# Patient Record
Sex: Female | Born: 1985 | Race: White | Hispanic: No | Marital: Single | Smoking: Never smoker
Health system: Southern US, Community
[De-identification: ages and names within clinical notes are randomized; demographics above are authoritative.]

## PROBLEM LIST (undated history)

## (undated) DIAGNOSIS — M797 Fibromyalgia: Secondary | ICD-10-CM

## (undated) DIAGNOSIS — E039 Hypothyroidism, unspecified: Secondary | ICD-10-CM

## (undated) DIAGNOSIS — J45901 Unspecified asthma with (acute) exacerbation: Secondary | ICD-10-CM

## (undated) DIAGNOSIS — D649 Anemia, unspecified: Secondary | ICD-10-CM

## (undated) DIAGNOSIS — R55 Syncope and collapse: Secondary | ICD-10-CM

## (undated) DIAGNOSIS — N83209 Unspecified ovarian cyst, unspecified side: Secondary | ICD-10-CM

## (undated) DIAGNOSIS — F32A Depression, unspecified: Secondary | ICD-10-CM

## (undated) DIAGNOSIS — N809 Endometriosis, unspecified: Secondary | ICD-10-CM

## (undated) DIAGNOSIS — E038 Other specified hypothyroidism: Secondary | ICD-10-CM

## (undated) DIAGNOSIS — F419 Anxiety disorder, unspecified: Secondary | ICD-10-CM

## (undated) DIAGNOSIS — K219 Gastro-esophageal reflux disease without esophagitis: Secondary | ICD-10-CM

## (undated) DIAGNOSIS — N159 Renal tubulo-interstitial disease, unspecified: Secondary | ICD-10-CM

## (undated) DIAGNOSIS — R5382 Chronic fatigue, unspecified: Secondary | ICD-10-CM

## (undated) DIAGNOSIS — G9332 Myalgic encephalomyelitis/chronic fatigue syndrome: Secondary | ICD-10-CM

## (undated) DIAGNOSIS — G901 Familial dysautonomia [Riley-Day]: Secondary | ICD-10-CM

## (undated) DIAGNOSIS — R12 Heartburn: Secondary | ICD-10-CM

## (undated) DIAGNOSIS — F329 Major depressive disorder, single episode, unspecified: Secondary | ICD-10-CM

## (undated) HISTORY — DX: Heartburn: R12

## (undated) HISTORY — DX: Gastro-esophageal reflux disease without esophagitis: K21.9

## (undated) HISTORY — DX: Anxiety disorder, unspecified: F41.9

## (undated) HISTORY — DX: Other specified hypothyroidism: E03.8

## (undated) HISTORY — DX: Fibromyalgia: M79.7

## (undated) HISTORY — DX: Endometriosis, unspecified: N80.9

## (undated) HISTORY — DX: Anemia, unspecified: D64.9

## (undated) HISTORY — DX: Unspecified ovarian cyst, unspecified side: N83.209

## (undated) HISTORY — DX: Depression, unspecified: F32.A

## (undated) HISTORY — PX: CYST EXCISION: SHX5701

## (undated) HISTORY — DX: Hypothyroidism, unspecified: E03.9

## (undated) HISTORY — PX: WISDOM TOOTH EXTRACTION: SHX21

## (undated) HISTORY — PX: DIAGNOSTIC LAPAROSCOPY: SUR761

## (undated) HISTORY — DX: Major depressive disorder, single episode, unspecified: F32.9

## (undated) HISTORY — PX: OTHER SURGICAL HISTORY: SHX169

---
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
## 2003-02-21 ENCOUNTER — Encounter: Payer: Self-pay | Admitting: Internal Medicine

## 2003-07-04 ENCOUNTER — Other Ambulatory Visit: Admission: RE | Admit: 2003-07-04 | Discharge: 2003-07-04 | Payer: Self-pay | Admitting: Family Medicine

## 2003-12-17 ENCOUNTER — Encounter: Admission: RE | Admit: 2003-12-17 | Discharge: 2003-12-17 | Payer: Self-pay | Admitting: Urology

## 2004-01-24 ENCOUNTER — Inpatient Hospital Stay (HOSPITAL_COMMUNITY): Admission: AD | Admit: 2004-01-24 | Discharge: 2004-01-24 | Payer: Self-pay | Admitting: Obstetrics & Gynecology

## 2004-01-26 ENCOUNTER — Emergency Department (HOSPITAL_COMMUNITY): Admission: EM | Admit: 2004-01-26 | Discharge: 2004-01-26 | Payer: Self-pay | Admitting: Emergency Medicine

## 2004-01-26 ENCOUNTER — Encounter (INDEPENDENT_AMBULATORY_CARE_PROVIDER_SITE_OTHER): Payer: Self-pay | Admitting: *Deleted

## 2004-01-29 ENCOUNTER — Encounter (INDEPENDENT_AMBULATORY_CARE_PROVIDER_SITE_OTHER): Payer: Self-pay | Admitting: *Deleted

## 2004-01-29 ENCOUNTER — Encounter (INDEPENDENT_AMBULATORY_CARE_PROVIDER_SITE_OTHER): Payer: Self-pay | Admitting: Specialist

## 2004-01-29 ENCOUNTER — Ambulatory Visit (HOSPITAL_COMMUNITY): Admission: RE | Admit: 2004-01-29 | Discharge: 2004-01-29 | Payer: Self-pay | Admitting: Obstetrics and Gynecology

## 2006-07-18 ENCOUNTER — Ambulatory Visit: Payer: Self-pay | Admitting: Psychiatry

## 2006-07-18 ENCOUNTER — Inpatient Hospital Stay (HOSPITAL_COMMUNITY): Admission: AD | Admit: 2006-07-18 | Discharge: 2006-07-21 | Payer: Self-pay | Admitting: Psychiatry

## 2006-09-05 ENCOUNTER — Ambulatory Visit: Payer: Self-pay | Admitting: Internal Medicine

## 2006-10-13 ENCOUNTER — Ambulatory Visit: Payer: Self-pay | Admitting: Internal Medicine

## 2006-11-23 ENCOUNTER — Encounter (INDEPENDENT_AMBULATORY_CARE_PROVIDER_SITE_OTHER): Payer: Self-pay | Admitting: *Deleted

## 2006-11-26 ENCOUNTER — Encounter (INDEPENDENT_AMBULATORY_CARE_PROVIDER_SITE_OTHER): Payer: Self-pay | Admitting: *Deleted

## 2006-12-07 ENCOUNTER — Encounter (INDEPENDENT_AMBULATORY_CARE_PROVIDER_SITE_OTHER): Payer: Self-pay | Admitting: *Deleted

## 2007-01-11 ENCOUNTER — Encounter (INDEPENDENT_AMBULATORY_CARE_PROVIDER_SITE_OTHER): Payer: Self-pay | Admitting: *Deleted

## 2007-07-24 ENCOUNTER — Encounter: Admission: RE | Admit: 2007-07-24 | Discharge: 2007-07-24 | Payer: Self-pay | Admitting: *Deleted

## 2007-07-25 ENCOUNTER — Ambulatory Visit (HOSPITAL_COMMUNITY): Admission: RE | Admit: 2007-07-25 | Discharge: 2007-07-25 | Payer: Self-pay | Admitting: Cardiovascular Disease

## 2007-09-20 ENCOUNTER — Encounter (INDEPENDENT_AMBULATORY_CARE_PROVIDER_SITE_OTHER): Payer: Self-pay | Admitting: *Deleted

## 2007-10-18 ENCOUNTER — Encounter (INDEPENDENT_AMBULATORY_CARE_PROVIDER_SITE_OTHER): Payer: Self-pay | Admitting: *Deleted

## 2007-12-10 ENCOUNTER — Emergency Department (HOSPITAL_COMMUNITY): Admission: EM | Admit: 2007-12-10 | Discharge: 2007-12-11 | Payer: Self-pay | Admitting: Emergency Medicine

## 2008-01-15 ENCOUNTER — Emergency Department (HOSPITAL_COMMUNITY): Admission: EM | Admit: 2008-01-15 | Discharge: 2008-01-15 | Payer: Self-pay | Admitting: Family Medicine

## 2008-01-29 ENCOUNTER — Ambulatory Visit: Payer: Self-pay | Admitting: Internal Medicine

## 2008-01-29 DIAGNOSIS — J45901 Unspecified asthma with (acute) exacerbation: Secondary | ICD-10-CM | POA: Insufficient documentation

## 2008-01-29 HISTORY — DX: Unspecified asthma with (acute) exacerbation: J45.901

## 2008-02-13 ENCOUNTER — Telehealth: Payer: Self-pay | Admitting: Internal Medicine

## 2008-03-06 ENCOUNTER — Encounter (INDEPENDENT_AMBULATORY_CARE_PROVIDER_SITE_OTHER): Payer: Self-pay | Admitting: *Deleted

## 2008-03-14 ENCOUNTER — Telehealth (INDEPENDENT_AMBULATORY_CARE_PROVIDER_SITE_OTHER): Payer: Self-pay | Admitting: *Deleted

## 2008-05-01 ENCOUNTER — Emergency Department (HOSPITAL_COMMUNITY): Admission: EM | Admit: 2008-05-01 | Discharge: 2008-05-01 | Payer: Self-pay | Admitting: Emergency Medicine

## 2008-05-03 ENCOUNTER — Emergency Department (HOSPITAL_COMMUNITY): Admission: EM | Admit: 2008-05-03 | Discharge: 2008-05-03 | Payer: Self-pay | Admitting: *Deleted

## 2008-05-03 ENCOUNTER — Encounter (INDEPENDENT_AMBULATORY_CARE_PROVIDER_SITE_OTHER): Payer: Self-pay | Admitting: *Deleted

## 2008-05-17 DIAGNOSIS — K219 Gastro-esophageal reflux disease without esophagitis: Secondary | ICD-10-CM | POA: Insufficient documentation

## 2008-05-17 DIAGNOSIS — F411 Generalized anxiety disorder: Secondary | ICD-10-CM | POA: Insufficient documentation

## 2008-05-17 DIAGNOSIS — F319 Bipolar disorder, unspecified: Secondary | ICD-10-CM | POA: Insufficient documentation

## 2008-05-17 DIAGNOSIS — IMO0001 Reserved for inherently not codable concepts without codable children: Secondary | ICD-10-CM | POA: Insufficient documentation

## 2008-05-17 DIAGNOSIS — R5382 Chronic fatigue, unspecified: Secondary | ICD-10-CM | POA: Insufficient documentation

## 2008-05-17 DIAGNOSIS — G9332 Myalgic encephalomyelitis/chronic fatigue syndrome: Secondary | ICD-10-CM | POA: Insufficient documentation

## 2008-05-17 DIAGNOSIS — N809 Endometriosis, unspecified: Secondary | ICD-10-CM | POA: Insufficient documentation

## 2008-05-17 DIAGNOSIS — F3289 Other specified depressive episodes: Secondary | ICD-10-CM | POA: Insufficient documentation

## 2008-05-17 DIAGNOSIS — G2581 Restless legs syndrome: Secondary | ICD-10-CM | POA: Insufficient documentation

## 2008-05-17 DIAGNOSIS — F329 Major depressive disorder, single episode, unspecified: Secondary | ICD-10-CM

## 2008-05-22 ENCOUNTER — Ambulatory Visit: Payer: Self-pay | Admitting: Internal Medicine

## 2008-05-22 DIAGNOSIS — R1011 Right upper quadrant pain: Secondary | ICD-10-CM | POA: Insufficient documentation

## 2008-05-23 ENCOUNTER — Ambulatory Visit (HOSPITAL_COMMUNITY): Admission: RE | Admit: 2008-05-23 | Discharge: 2008-05-23 | Payer: Self-pay | Admitting: Internal Medicine

## 2008-05-23 ENCOUNTER — Encounter: Payer: Self-pay | Admitting: Internal Medicine

## 2008-05-24 ENCOUNTER — Encounter: Payer: Self-pay | Admitting: Internal Medicine

## 2008-05-28 ENCOUNTER — Ambulatory Visit: Payer: Self-pay | Admitting: Internal Medicine

## 2008-06-03 ENCOUNTER — Encounter: Payer: Self-pay | Admitting: Internal Medicine

## 2008-09-21 ENCOUNTER — Emergency Department (HOSPITAL_COMMUNITY): Admission: EM | Admit: 2008-09-21 | Discharge: 2008-09-21 | Payer: Self-pay | Admitting: Emergency Medicine

## 2009-04-03 ENCOUNTER — Encounter: Payer: Self-pay | Admitting: Internal Medicine

## 2009-07-24 ENCOUNTER — Telehealth: Payer: Self-pay | Admitting: Internal Medicine

## 2009-07-24 ENCOUNTER — Ambulatory Visit: Payer: Self-pay | Admitting: Gastroenterology

## 2009-07-24 DIAGNOSIS — R1084 Generalized abdominal pain: Secondary | ICD-10-CM | POA: Insufficient documentation

## 2009-07-24 DIAGNOSIS — K5649 Other impaction of intestine: Secondary | ICD-10-CM | POA: Insufficient documentation

## 2009-07-24 DIAGNOSIS — R195 Other fecal abnormalities: Secondary | ICD-10-CM | POA: Insufficient documentation

## 2009-07-24 DIAGNOSIS — R110 Nausea: Secondary | ICD-10-CM | POA: Insufficient documentation

## 2009-07-25 ENCOUNTER — Encounter: Payer: Self-pay | Admitting: Nurse Practitioner

## 2009-07-25 DIAGNOSIS — B820 Intestinal helminthiasis, unspecified: Secondary | ICD-10-CM | POA: Insufficient documentation

## 2009-07-26 ENCOUNTER — Encounter: Payer: Self-pay | Admitting: Nurse Practitioner

## 2009-07-29 ENCOUNTER — Telehealth: Payer: Self-pay | Admitting: Internal Medicine

## 2009-08-20 ENCOUNTER — Ambulatory Visit: Payer: Self-pay | Admitting: Internal Medicine

## 2009-08-20 LAB — CONVERTED CEMR LAB
ALT: 18 U/L (ref 0–35)
AST: 20 U/L (ref 0–37)
Albumin: 3.3 g/dL — ABNORMAL LOW (ref 3.5–5.2)
Alkaline Phosphatase: 65 U/L (ref 39–117)
Amylase: 74 U/L (ref 27–131)
Basophils Absolute: 0 K/uL (ref 0.0–0.1)
Basophils Relative: 0.9 % (ref 0.0–3.0)
Bilirubin, Direct: 0.2 mg/dL (ref 0.0–0.3)
Eosinophils Absolute: 0.1 K/uL (ref 0.0–0.7)
Eosinophils Relative: 2 % (ref 0.0–5.0)
HCT: 36.7 % (ref 36.0–46.0)
Hemoglobin: 13 g/dL (ref 12.0–15.0)
Lipase: 22 U/L (ref 11.0–59.0)
Lymphocytes Relative: 41.4 % (ref 12.0–46.0)
Lymphs Abs: 1.9 K/uL (ref 0.7–4.0)
MCHC: 35.3 g/dL (ref 30.0–36.0)
MCV: 90.5 fL (ref 78.0–100.0)
Monocytes Absolute: 0.3 K/uL (ref 0.1–1.0)
Monocytes Relative: 6.8 % (ref 3.0–12.0)
Neutro Abs: 2.4 K/uL (ref 1.4–7.7)
Neutrophils Relative %: 48.9 % (ref 43.0–77.0)
Platelets: 192 K/uL (ref 150.0–400.0)
RBC: 4.06 M/uL (ref 3.87–5.11)
RDW: 12.7 % (ref 11.5–14.6)
Total Bilirubin: 0.6 mg/dL (ref 0.3–1.2)
Total Protein: 6.5 g/dL (ref 6.0–8.3)
WBC: 4.7 10*3/microliter (ref 4.5–10.5)

## 2009-08-27 ENCOUNTER — Ambulatory Visit (HOSPITAL_COMMUNITY): Admission: RE | Admit: 2009-08-27 | Discharge: 2009-08-27 | Payer: Self-pay | Admitting: Cardiology

## 2009-09-01 ENCOUNTER — Telehealth: Payer: Self-pay | Admitting: Internal Medicine

## 2009-09-08 ENCOUNTER — Ambulatory Visit (HOSPITAL_COMMUNITY): Admission: RE | Admit: 2009-09-08 | Discharge: 2009-09-08 | Payer: Self-pay | Admitting: Internal Medicine

## 2009-09-19 ENCOUNTER — Telehealth: Payer: Self-pay | Admitting: Internal Medicine

## 2009-09-22 ENCOUNTER — Ambulatory Visit: Payer: Self-pay | Admitting: Internal Medicine

## 2009-09-22 DIAGNOSIS — R112 Nausea with vomiting, unspecified: Secondary | ICD-10-CM | POA: Insufficient documentation

## 2009-09-23 ENCOUNTER — Encounter: Payer: Self-pay | Admitting: Internal Medicine

## 2009-09-24 ENCOUNTER — Encounter: Payer: Self-pay | Admitting: Internal Medicine

## 2009-12-18 ENCOUNTER — Telehealth: Payer: Self-pay | Admitting: Internal Medicine

## 2009-12-23 ENCOUNTER — Emergency Department (HOSPITAL_COMMUNITY): Admission: EM | Admit: 2009-12-23 | Discharge: 2009-12-23 | Payer: Self-pay | Admitting: Emergency Medicine

## 2010-06-28 ENCOUNTER — Encounter: Payer: Self-pay | Admitting: Internal Medicine

## 2010-07-07 NOTE — Progress Notes (Signed)
Summary: Provider Switch Request  Phone Note Call from Patient   Caller: Patient Call For: Dr. Juanda Chance Reason for Call: Talk to Nurse Summary of Call: pt. would like to switch from Dr. Juanda Chance to Dr. Leone Payor b/c last time she saw Dr. Juanda Chance she made a comment about her "being overweight." Initial call taken by: Karna Christmas,  December 18, 2009 9:54 AM  Follow-up for Phone Call        DR.Marketta Valadez--Do you approve switch? Follow-up by: Laureen Ochs LPN,  December 18, 2009 2:55 PM  Additional Follow-up for Phone Call Additional follow up Details #1::        yes, I hope Dr Leone Payor takes her. Additional Follow-up by: Hart Carwin MD,  December 18, 2009 5:38 PM    Additional Follow-up for Phone Call Additional follow up Details #2::    DR.GESSNER--Will you accept this patient? (I believe several of her family members have also switched to you.) Follow-up by: Laureen Ochs LPN,  December 19, 2009 8:06 AM  Additional Follow-up for Phone Call Additional follow up Details #3:: Details for Additional Follow-up Action Taken: Not a legitimate reason to switch, so I do not accept. BMI is 29 and she is overweight and it is appropriate for her to be told that and I would do same and tell her to lose Iva Boop MD, [facility]  December 19, 2009 12:21 PM      Appended Document: Provider Switch Request Message left for patient to callback.  Appended Document: Provider Switch Request Per pt's mother, pt. has a history of an eating disorder, states Dr.Brailyn Killion stating pt. needed to loose weight has caused pt's depression to become worse. I advised pt's mother of above MD decision, advised that Jolina is still a pt. of Dr.Odai Wimmer here at [facility] and that pt. may seek GI care at another GI office if she wishes. Pt's mother stated thank you.

## 2010-07-07 NOTE — Progress Notes (Signed)
Summary: Triage-Severe reflux and nausea  Phone Note Call from Patient Call back at Home Phone [phone]   Caller: mother Silvio Pate Call For: Dr. Juanda Chance Reason for Call: Talk to Nurse Summary of Call: Having acid reflux even sitting up, vomiting. Initial call taken by: Karna Christmas,  September 19, 2009 9:50 AM  Follow-up for Phone Call        Per pt. mother, pt. continues w/episodes of reflux and nausea, had a bad episode yesterday and last night. Takes Phenergan 12.5mg  Q4-6, Protonix 40mg  daily, Prilosec PRN, Bentyl as needed.  1) See Dr.Trystyn Sitts on 09-22-09 at 2:45am 2) Clear liquids x24 hours, then full liquids X24 hours, then advance to soft,bland diet x2-4 days. Advanced as tolerated. 3) Bentyl 10mg  two times a day until OV 4) Maalox,Mylanta,etc  as needed 5) Continue Protonix QD and Phenergan PRN 6) If symptoms become worse call back immediately or go to ER. 7) I will call pt., if new orders, after MD reviews.    Follow-up by: Laureen Ochs LPN,  September 19, 2009 10:16 AM  Additional Follow-up for Phone Call Additional follow up Details #1::        Per Dr.Anaaya Fuster-Above instructions are O.K.  Change Phenergan to Zofran. Pt. to keep scheduled office visit 09-22-09 and call back as needed. Reviewed with Kadie's father by phone, Zofran to her pharmacy.   Additional Follow-up by: Laureen Ochs LPN,  September 19, 2009 12:58 PM    New/Updated Medications: ONDANSETRON 4 MG TBDP (ONDANSETRON) Take 1 every 6-8 hours as needed for nausea. Prescriptions: ONDANSETRON 4 MG TBDP (ONDANSETRON) Take 1 every 6-8 hours as needed for nausea.  #20 x 1   Entered by:   Laureen Ochs LPN   Authorized by:   Hart Carwin MD   Signed by:   Laureen Ochs LPN on 14/78/2956   Method used:   Electronically to        [facility] [address] [phone]* (retail)       [address]       [place], [place]  [address]       Ph: [phone] or [phone]       Fax: [phone]   RxID:    [id number]

## 2010-07-07 NOTE — Assessment & Plan Note (Signed)
Summary: SEVERE REFLUX, NAUSEA            DEBORAH   History of Present Illness Visit Type: Follow-up Visit Primary GI MD: Lina Sar MD Primary Provider: Gilmore Laroche, MD Chief Complaint: acid reflux and nausea-Zofran, bentyl and protonix helping some, pt has been on broth and clear liquids the last four days, pt also states she is having a pain in her left arm that begins in left shoulder and shoots down to her hand that can make her hand numb History of Present Illness:   This is a 25 year old white female with periodic nausea, vomiting and severe gastroesophageal reflux demonstrated on an upper GI series showing free reflux up to the level of the esophagus on 08/27/09. Her ultrasound and HIDA scan  have been negative. An upper endoscopy completed in December 2009 which was negative. She has bipolar disorder, fibromyalgia and depression as well as chronic fatigue syndrome. She is currently not working because of her health issues. She is interested in the possibility of a Nissen fundoplication. She comes today with her mother.   GI Review of Systems    Reports acid reflux and  nausea.          Current Medications (verified): 1)  Prozac 20 Mg Caps (Fluoxetine Hcl) .... Take 1 Tablet By Mouth Once A Day 2)  Nuvaring 0.12-0.015 Mg/24hr Ring (Etonogestrel-Ethinyl Estradiol) .... Use As Directed 3)  Ondansetron 4 Mg Tbdp (Ondansetron) .... Take 1 Every 6-8 Hours As Needed For Nausea. 4)  Bentyl 10 Mg Caps (Dicyclomine Hcl) .... Take 1 Tab Twice Daily As Needed For Abd Cramps, Spasms 5)  Protonix 40 Mg Tbec (Pantoprazole Sodium) .... Take 1 Tablet By Mouth Once A Day 6)  Pepto-Bismol 262 Mg Tabs (Bismuth Subsalicylate) .... As Needed 7)  Topamax 50 Mg Tabs (Topiramate) .... Take 1 Tablet By Mouth Once A Day  Allergies: 1)  ! Codeine 2)  Ambien (Zolpidem Tartrate) 3)  Lamictal (Lamotrigine) 4)  Abilify (Aripiprazole)  Past History:  Past Medical History: Asthma Current Problems:    ENDOMETRIOSIS (ICD-617.9) FIBROMYALGIA (ICD-729.1) CHRONIC FATIGUE SYNDROME (ICD-780.71) BIPOLAR AFFECTIVE DISORDER (ICD-296.80) GERD (ICD-530.81) RESTLESS LEG SYNDROME (ICD-333.94) ANXIETY DISORDER (ICD-300.00) DEPRESSION (ICD-311) ASTHMA UNSPECIFIED WITH EXACERBATION (ICD-493.92) Chronic Headaches  Past Surgical History: Reviewed history from 05/17/2008 and no changes required. laporoscopy for endometriosis  Family History: Reviewed history from 05/17/2008 and no changes required. neg resp dz Family History of Breast Cancer: Paternal Grandmother No FH of Colon Cancer: Family History of Irritable Bowel Syndrome: Mother Family History of Diabetes: Grandfather  Social History: Reviewed history from 05/22/2008 and no changes required. never smoker Alcohol Use - yes-1 time per month Illicit Drug Use - no Occupation: Unemployed  Review of Systems       The patient complains of allergy/sinus and cough.  The patient denies anemia, anxiety-new, arthritis/joint pain, back pain, blood in urine, breast changes/lumps, confusion, coughing up blood, depression-new, fainting, fatigue, fever, headaches-new, hearing problems, heart murmur, heart rhythm changes, itching, menstrual pain, muscle pains/cramps, night sweats, nosebleeds, pregnancy symptoms, shortness of breath, skin rash, sleeping problems, sore throat, swelling of feet/legs, swollen lymph glands, thirst - excessive, urination - excessive, urination changes/pain, urine leakage, vision changes, and voice change.         Pertinent positive and negative review of systems were noted in the above HPI. All other ROS was otherwise negative.   Vital Signs:  Patient profile:   25 year old female Height:      66 inches  Weight:      180 pounds BMI:     29.16 Pulse rate:   92 / minute Pulse rhythm:   regular BP sitting:   100 / 66  (left arm) Cuff size:   regular  Vitals Entered By: Francee Piccolo CMA Duncan Dull) (September 22, 2009 3:04  PM)  Physical Exam  General:  depressed-appearing, alert, oriented. Eyes:  PERRLA, no icterus. Mouth:  No deformity or lesions, dentition normal. Neck:  Supple; no masses or thyromegaly. Lungs:  Clear throughout to auscultation. Heart:  Regular rate and rhythm; no murmurs, rubs,  or bruits. Abdomen:  soft relaxed abdomen with minimal tenderness in epigastrium towards the midline. Lower abdomen unremarkable. Liver edge at costal margin. Extremities:  No clubbing, cyanosis, edema or deformities noted. Skin:  Intact without significant lesions or rashes. Psych:  Alert and cooperative. Normal mood and affect.   Impression & Recommendations:  Problem # 1:  NAUSEA AND VOMITING (ICD-787.01) Patient has nausea and vomiting. We need to rule out gastroparesis or functional vomiting. Patient appears to be depressed and anxious. She has free gastroesophageal reflux demonstrated on an upper GI series. At this point, I will increase the Protonix to twice a day dosing before considering fundoplication.I would be reluctant to consider Nissen Fundoplication  because of so many psychological issues.  Problem # 2:  FIBROMYALGIA (ICD-729.1) Patient appears to be depressed. She is unable to work. She is taking Prozac 60 mg daily which may affect her gastric emptying.  Other Orders: Gastric Emptying Scan (GES)  Patient Instructions: 1)  You have been scheduled for a gastric emptying scan to see how long it takes your stomach to empty. This will be at [facility] on 10/01/09 @ 11:00 am. 2)  Please discontinue your ondansetron 3 days prior to test.  3)  We have sent a new prescription for Protonix two times a day to your pharmacy. 4)  Copy sent to : Gilmore Laroche, MD 5)  The medication list was reviewed and reconciled.  All changed / newly prescribed medications were explained.  A complete medication list was provided to the patient / caregiver. Prescriptions: PROTONIX 40 MG TBEC (PANTOPRAZOLE  SODIUM) Take 1 tablet by mouth two times a day (pharmacy-please d/c prescription for protonix once daily dosing!)  #60 x 3   Entered by:   Hortense Ramal CMA (AAMA)   Authorized by:   Hart Carwin MD   Signed by:   Hortense Ramal CMA (AAMA) on 09/22/2009   Method used:   Electronically to        [facility] [address] [phone]* (retail)       [address]       [place], [place]  [address]       Ph: [phone] or [phone]       Fax: [phone]   RxID:   [id number]   Appended Document: SEVERE REFLUX, NAUSEA            DEBORAH Of Note: Patient cancelled her gastric emptying scan and states that she does not plan to reschedule that. Dr Juanda Chance has been advised.

## 2010-07-07 NOTE — Medication Information (Signed)
Summary: Approval/[employer]  Approval/[employer]   Imported By: Lester Leeds 09/29/2009 10:07:00  _____________________________________________________________________  External Attachment:    Type:   Image     Comment:   External Document

## 2010-07-07 NOTE — Progress Notes (Signed)
Summary: Questions about Lab Orders  Phone Note From Other Clinic   Caller: [facility]  [phone] Call For: Dr. Juanda Chance Summary of Call: Has some questions about a Misc GI Procedure that was ordered Initial call taken by: Karna Christmas,  July 29, 2009 9:39 AM  Follow-up for Phone Call        Msg. left for Tara-Please identify the worm in the pt. stool, per Willette Cluster NP.  Follow-up by: Laureen Ochs LPN,  July 29, 2009 9:44 AM

## 2010-07-07 NOTE — Progress Notes (Signed)
Summary: TRIAGE  Phone Note Call from Patient Call back at [phone]   Caller: Patient Call For: Juanda Chance Reason for Call: Talk to Nurse Summary of Call: Patient is having a flare up of IBS has a lot of stomach cramping, wants to know if she can get pain meds called in Initial call taken by: Tawni Levy,  July 24, 2009 1:10 PM  Follow-up for Phone Call        Pt. c/o "Serious stomache cramps"  mid abd. since last night. Some nausea. Denies fever, constipation, diarrhea, blood, black stools. Pain is getting worse.  Pt. will see Willette Cluster NP today at 3pm.  Follow-up by: Laureen Ochs LPN,  July 24, 2009 1:41 PM

## 2010-07-07 NOTE — Miscellaneous (Signed)
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

## 2010-07-07 NOTE — Progress Notes (Signed)
Summary: wrong "form"  Phone Note Call from Patient Call back at Home Phone [phone]   Caller: mother, Velna Hatchet Call For: Dr. Juanda Chance Reason for Call: Talk to Nurse Summary of Call: mother thinks pt was given the wrong "form" regarding an exam with Nuclear Medicine Initial call taken by: Vallarie Mare,  September 01, 2009 9:59 AM  Follow-up for Phone Call        The form patient has was given to her for her own personal use so she will know when to arrive for tests etc..Marland KitchenMarland KitchenI have called radiology at [facility] who states that they do have an order on file for the HIDA scan and that there is no problem. Patient has been advised that no wrong form was given. Follow-up by: Hortense Ramal CMA Duncan Dull),  September 01, 2009 10:59 AM

## 2010-07-07 NOTE — Assessment & Plan Note (Signed)
Summary: WORSENING ABD. PAIN          (DR.BRODIE PT.)          DEBORAH   History of Present Illness Visit Type: Follow-up Visit Primary GI MD: Lina Sar MD Primary Provider: Tanya Nones at [facility] Chief Complaint: Non-stop epigastric abd pain all night History of Present Illness:   April [name] is back with recurrent episodes of nausea, abdominal cramps, bowel habit changes associated with worm-like structures in stool. She was treated by PCP in 2009 and again Nov. 2010 with Mebendazole after PCP saw a 6-7 cm roundworm in her stool  (I reviewed notes).  Structures size of angel hair pasta, whitish in color. This most recent episode started yesterday evening. She had one BM containing above structures, no BMs today. Today her abdominal cramps span the mid abdomen, they are constant.No dysuria, no unusual vaginal discharge. No weight loss.      GI Review of Systems    Reports abdominal pain and  nausea.     Location of  Abdominal pain: mid abdomen.    Denies acid reflux, belching, bloating, chest pain, dysphagia with liquids, dysphagia with solids, heartburn, loss of appetite, vomiting, vomiting blood, weight loss, and  weight gain.      Reports constipation and  diarrhea.     Denies anal fissure, black tarry stools, change in bowel habit, diverticulosis, fecal incontinence, heme positive stool, hemorrhoids, irritable bowel syndrome, jaundice, light color stool, liver problems, rectal bleeding, and  rectal pain.    Current Medications (verified): 1)  Prozac 20 Mg Caps (Fluoxetine Hcl) .... Take 1 Tablet By Mouth Once A Day 2)  Nuvaring 0.12-0.015 Mg/24hr Ring (Etonogestrel-Ethinyl Estradiol) .... Use As Directed  Allergies: 1)  ! Codeine 2)  ! * Ambien 3)  ! * Lamictal 4)  ! * Abilify  Past History:  Past Medical History: Reviewed history from 05/17/2008 and no changes required. Asthma Current Problems:  ENDOMETRIOSIS (ICD-617.9) FIBROMYALGIA (ICD-729.1) CHRONIC FATIGUE SYNDROME  (ICD-780.71) BIPOLAR AFFECTIVE DISORDER (ICD-296.80) GERD (ICD-530.81) RESTLESS LEG SYNDROME (ICD-333.94) ANXIETY DISORDER (ICD-300.00) DEPRESSION (ICD-311) ASTHMA UNSPECIFIED WITH EXACERBATION (AVW-098.11)  Past Surgical History: Reviewed history from 05/17/2008 and no changes required. laporoscopy for endometriosis  Family History: Reviewed history from 05/17/2008 and no changes required. neg resp dz Family History of Breast Cancer: Paternal Grandmother No FH of Colon Cancer: Family History of Irritable Bowel Syndrome: Mother Family History of Diabetes: Grandfather  Social History: Reviewed history from 05/22/2008 and no changes required. never smoker Alcohol Use - yes-1 time per month Illicit Drug Use - no Occupation: Unemployed  Review of Systems       The patient complains of allergy/sinus, arthritis/joint pain, fatigue, headaches-new, menstrual pain, nosebleeds, skin rash, and thirst - excessive.  The patient denies anemia, anxiety-new, back pain, blood in urine, breast changes/lumps, change in vision, confusion, cough, coughing up blood, depression-new, fainting, fever, hearing problems, heart murmur, heart rhythm changes, itching, muscle pains/cramps, night sweats, pregnancy symptoms, shortness of breath, sleeping problems, sore throat, swelling of feet/legs, swollen lymph glands, thirst - excessive , urination - excessive , urination changes/pain, urine leakage, vision changes, and voice change.    Vital Signs:  Patient profile:   25 year old female Height:      66 inches Weight:      179.38 pounds BMI:     29.06 Temp:     98.7 degrees F oral Pulse rate:   64 / minute Pulse rhythm:   regular BP sitting:   100 /  68  (left arm) Cuff size:   regular  Vitals Entered By: June McMurray CMA Duncan Dull) (July 24, 2009 3:20 PM)  Physical Exam  General:  Well developed, well nourished, no acute distress. Head:  Normocephalic and atraumatic. Mouth:  No oral lesions.  Tongue moist.  Neck:  no obvious masses  Lungs:  Clear throughout to auscultation. Heart:  Regular rate and rhythm; no murmurs, rubs,  or bruits. Abdomen:  Abdomen soft, nontender, nondistended. No obvious masses or hepatomegaly.Normal bowel sounds.  Msk:  Symmetrical with no gross deformities. Normal posture. Extremities:  No palmar erythema, no edema.  Neurologic:  Alert and  oriented x4;  grossly normal neurologically. Skin:  Intact without significant lesions or rashes. Cervical Nodes:  No significant cervical adenopathy. Psych:  Alert and cooperative. Normal mood and affect.   Impression & Recommendations:  Problem # 1:  ABDOMINAL PAIN -GENERALIZED (ICD-789.07) Assessment Comment Only Recurrent episode of mid abdominal pain, nausea, and passage of fecal worm-like structures in stool.  Treated by PCP in 2009 and again Nov. 2010 with  Mebendazole after PCP visualized  a 6-7cm roundworm in her feces. Stool for O + P and culture were negative. Her BMs are otherwise normal.  Last night she passed another abnormal stool. Today she is left with constant mid abdominal pain. No recent travel.  Colonoscopy (for abdominal pain in Dec. 2009) showed some abnormal mucosa of sigmoid. Biopsies compatible with colonic mucosa with superficial hemorrhages in lamina propria and reactive lymphoid aggregates. Patient looks healthy, her abdominal exam is unremarkable. Patient will follow up with Dr. Juanda Chance but in meantime I have asked her to take a stool container so that when this occurs again we can send sample for analysis. I will call [facility] as well as hospital (microbiology) to find out what type of stool analysis can be done. In the meantime, Phenergan for nausea and Bentyl for cramps.   Patient Instructions: 1)  Please pick up your medications at the pharmacy. 2)  You can go to the lab in our basement and they can give you a small container for stool in case you might need it. 3)  We made you a  follow up with Dr. Juanda Chance for 08-20-09 at 8:45Am. 4)  Copy sent to : Dr. Tanya Nones 5)  The medication list was reviewed and reconciled.  All changed / newly prescribed medications were explained.  A complete medication list was provided to the patient / caregiver. Prescriptions: BENTYL 10 MG CAPS (DICYCLOMINE HCL) Take 1 tab twice daily as needed for abd cramps, spasms  #60 x 0   Entered by:   Lowry Ram NCMA   Authorized by:   Willette Cluster NP   Signed by:   Lowry Ram NCMA on 07/24/2009   Method used:   Electronically to        [facility] [address] [phone]* (retail)       [address]       [place], [place]  [address]       Ph: [phone] or [phone]       Fax: [phone]   RxID:   [id number] PROMETHAZINE HCL 12.5 MG TABS (PROMETHAZINE HCL) Take 1 tab every 4-6 hours as needed for nausea Do not operate machinery.  #20 x 0   Entered by:   Lowry Ram NCMA   Authorized by:   Willette Cluster NP   Signed by:   Lowry Ram NCMA on  07/24/2009   Method used:   Electronically to        [facility] [address] [phone]* (retail)       [address]       [place], [place]  [address]       Ph: [phone] or [phone]       Fax: [phone]   RxID:   [id number]

## 2010-07-07 NOTE — Miscellaneous (Signed)
Summary: Protonix BID Approved/[employer]  Case ID: [id number] Member Number: [id number] Case Type: Initial Review Case Start Date: 09/23/2009 Case Status: Coverage has been APPROVED. You will receive a confirmation letter confirming approval of this medication. The patient will also be notified of this approval via an automated outbound phone call or a letter. Please allow approximately 2 hours to update our system with the approval. Once updated, the prescription can be re-submitted.   Coverage Start Date: 09/02/2009 Coverage End Date: 09/23/2010  Patient First Name: April Patient Last Name: [name] DOB: 06/10/1985 [address] Street Address: [address]   Patient City: [place] Patient State: [place] Patient Zip: [phone]  Drug Name & Strength: Pantoprazole Sodium 40 Mg

## 2010-07-07 NOTE — Assessment & Plan Note (Signed)
Summary: F/U ABD PAIN, SAW April [name] RNP   History of Present Illness Visit Type: Follow-up Visit Primary GI MD: Lina Sar MD Primary Provider: Tanya Nones at [facility] Chief Complaint: Patient states worsening abdominal pain, with nausea and vomiting after meals History of Present Illness:   This is a 25 year old white female whom we have seen several times for episodes of abdominal pain. She saw Willette Cluster, NP on 07/24/09 and was started on dicyclomine and Phenergan. She had another episode of severe abdominal pain and vomiting one week ago after eating seafood. She had a low-grade temperature, and is still feeling crampy pain but there has been no diarrhea and no fever. She has  a positive family history of gallbladder disease in her mother. Her upper abdominal ultrasound in December 2009 was negative. A colonoscopy in December 2009 was normal with normal biopsies and an upper endoscopy in December 2009 was also normal.   GI Review of Systems    Reports abdominal pain, bloating, heartburn, loss of appetite, nausea, and  vomiting.     Location of  Abdominal pain: lower abdomen.    Denies acid reflux, belching, chest pain, dysphagia with liquids, dysphagia with solids, vomiting blood, weight loss, and  weight gain.        Denies anal fissure, black tarry stools, change in bowel habit, constipation, diarrhea, diverticulosis, fecal incontinence, heme positive stool, hemorrhoids, irritable bowel syndrome, jaundice, light color stool, liver problems, rectal bleeding, and  rectal pain.    Current Medications (verified): 1)  Prozac 20 Mg Caps (Fluoxetine Hcl) .... Take 1 Tablet By Mouth Once A Day 2)  Nuvaring 0.12-0.015 Mg/24hr Ring (Etonogestrel-Ethinyl Estradiol) .... Use As Directed 3)  Promethazine Hcl 12.5 Mg Tabs (Promethazine Hcl) .... Take 1 Tab Every 4-6 Hours As Needed For Nausea Do Not Operate Machinery. 4)  Bentyl 10 Mg Caps (Dicyclomine Hcl) .... Take 1 Tab Twice Daily As  Needed For Abd Cramps, Spasms 5)  Acid Reducer 75 Mg Tabs (Ranitidine Hcl) .... As Needed  Allergies (verified): 1)  ! Codeine 2)  ! * Ambien 3)  ! * Lamictal 4)  ! * Abilify  Past History:  Past Medical History: Reviewed history from 05/17/2008 and no changes required. Asthma Current Problems:  ENDOMETRIOSIS (ICD-617.9) FIBROMYALGIA (ICD-729.1) CHRONIC FATIGUE SYNDROME (ICD-780.71) BIPOLAR AFFECTIVE DISORDER (ICD-296.80) GERD (ICD-530.81) RESTLESS LEG SYNDROME (ICD-333.94) ANXIETY DISORDER (ICD-300.00) DEPRESSION (ICD-311) ASTHMA UNSPECIFIED WITH EXACERBATION (ZOX-096.04)  Past Surgical History: Reviewed history from 05/17/2008 and no changes required. laporoscopy for endometriosis  Family History: Reviewed history from 05/17/2008 and no changes required. neg resp dz Family History of Breast Cancer: Paternal Grandmother No FH of Colon Cancer: Family History of Irritable Bowel Syndrome: Mother Family History of Diabetes: Grandfather  Social History: Reviewed history from 05/22/2008 and no changes required. never smoker Alcohol Use - yes-1 time per month Illicit Drug Use - no Occupation: Unemployed  Review of Systems       The patient complains of allergy/sinus, arthritis/joint pain, back pain, cough, fatigue, fever, headaches-new, menstrual pain, night sweats, and nosebleeds.  The patient denies anemia, anxiety-new, blood in urine, breast changes/lumps, change in vision, confusion, coughing up blood, depression-new, fainting, heart murmur, heart rhythm changes, itching, muscle pains/cramps, pregnancy symptoms, shortness of breath, skin rash, sleeping problems, sore throat, swelling of feet/legs, swollen lymph glands, thirst - excessive , urination - excessive , urination changes/pain, urine leakage, vision changes, and voice change.         Pertinent positive and negative review of  systems were noted in the above HPI. All other ROS was otherwise negative.   Vital  Signs:  Patient profile:   25 year old female Height:      66 inches Weight:      179.38 pounds BMI:     29.06 Temp:     97.7 degrees F oral Pulse rate:   64 / minute Pulse rhythm:   regular BP sitting:   100 / 70  (left arm) Cuff size:   regular  Vitals Entered By: June McMurray CMA Duncan Dull) (August 20, 2009 9:09 AM)  Physical Exam  General:  Well developed, well nourished, no acute distress. Mouth:  No deformity or lesions, dentition normal. Neck:  Supple; no masses or thyromegaly. Lungs:  Clear throughout to auscultation. Heart:  Regular rate and rhythm; no murmurs, rubs,  or bruits. Abdomen:  soft abdomen with normal active bowel sounds. There are a few hyperactive rushes in upper abdomen. Diffuse tenderness more so in the periumbilical area. Liver edge at costal margin. Lower abdomen unremarkable. Extremities:  No clubbing, cyanosis, edema or deformities noted. Skin:  Intact without significant lesions or rashes. Psych:  Alert and cooperative. Normal mood and affect.   Impression & Recommendations:  Problem # 1:  ABDOMINAL PAIN -GENERALIZED (ICD-789.07) Patient has periumbilical abdominal pain with an episodic pattern. She has a history of irritable bowel syndrome. We need to rule out biliary dysfunction or small bowel obstruction.We also need to rule out gastritis and hyperacidity. We will proceed with an upper GI with small bowel follow-through  to look foe Crohn's disease.and obtain a HIDA scan with CCK. We will also obtain liver function tests, amylase and lipase today. She will be started on Protonix 40 mg daily and will remain on a bland diet until her symptoms resolve.  Problem # 2:  ABDOMINAL PAIN RIGHT UPPER QUADRANT (ICD-789.01) Patient had a normal upper abdominal ultrasound. We will schedule a HIDA scan with CCK and obtain liver function tests for further evaluation of patient's abdominal pain. Orders: HIDA CCK (HIDA CCK) UGI with SBFT (UGI w/SBFT) TLB-Hepatic/Liver  Function Pnl (80076-HEPATIC) TLB-Amylase (82150-AMYL) TLB-Lipase (83690-LIPASE) TLB-CBC Platelet - w/Differential (85025-CBCD)  Patient Instructions: 1)  Please go to the basement to have your labs drawn (lft's, amylase,lipase, cbc) 2)  Upper GI with Small Bowel Follow Thru has been scheduled for 08/27/09. 3)  HIDA scan with CCK has been scheduled for  09/08/09. 4)  We will send protonix once daily to your pharmacy for you to pick up. 5)  Copy sent to : Dr Gilmore Laroche 6)  The medication list was reviewed and reconciled.  All changed / newly prescribed medications were explained.  A complete medication list was provided to the patient / caregiver. 7)  The medication list was reviewed and reconciled.  All changed / newly prescribed medications were explained.  A complete medication list was provided to the patient / caregiver. Prescriptions: PROTONIX 40 MG TBEC (PANTOPRAZOLE SODIUM) Take 1 tablet by mouth once a day  #30 x 3   Entered by:   Hortense Ramal CMA (AAMA)   Authorized by:   Hart Carwin MD   Signed by:   Hortense Ramal CMA (AAMA) on 08/20/2009   Method used:   Electronically to        [facility] [address] [phone]* (retail)       [address]       [place], [place]  [address]  Ph: [phone] or [phone]       Fax: [phone]   RxID:   [id number]

## 2010-08-22 LAB — DIFFERENTIAL
Basophils Absolute: 0 10*3/uL (ref 0.0–0.1)
Basophils Relative: 1 % (ref 0–1)
Eosinophils Relative: 1 % (ref 0–5)
Monocytes Absolute: 0.3 10*3/uL (ref 0.1–1.0)
Neutro Abs: 2 10*3/uL (ref 1.7–7.7)

## 2010-08-22 LAB — URINALYSIS, ROUTINE W REFLEX MICROSCOPIC
Glucose, UA: NEGATIVE mg/dL
Hgb urine dipstick: NEGATIVE
Nitrite: NEGATIVE
Specific Gravity, Urine: 1.027 (ref 1.005–1.030)
pH: 6 (ref 5.0–8.0)

## 2010-08-22 LAB — COMPREHENSIVE METABOLIC PANEL
ALT: 15 U/L (ref 0–35)
AST: 21 U/L (ref 0–37)
CO2: 20 mEq/L (ref 19–32)
Chloride: 109 mEq/L (ref 96–112)
Creatinine, Ser: 0.93 mg/dL (ref 0.4–1.2)
GFR calc non Af Amer: >60 mL/min (ref >60)
Total Bilirubin: 0.5 mg/dL (ref 0.3–1.2)

## 2010-08-22 LAB — CBC
Hemoglobin: 12.9 g/dL (ref 12.0–15.0)
MCV: 89.2 fL (ref 78.0–100.0)
Platelets: 244 10*3/uL (ref 150–400)
RBC: 4.07 MIL/uL (ref 3.87–5.11)
WBC: 4.4 10*3/uL (ref 4.0–10.5)

## 2010-09-14 ENCOUNTER — Emergency Department (HOSPITAL_COMMUNITY): Payer: 59

## 2010-09-14 ENCOUNTER — Emergency Department (HOSPITAL_COMMUNITY)
Admission: EM | Admit: 2010-09-14 | Discharge: 2010-09-14 | Disposition: A | Discharge status: Home / Self Care | Payer: 59 | Attending: Emergency Medicine | Admitting: Emergency Medicine

## 2010-09-14 ENCOUNTER — Encounter (HOSPITAL_COMMUNITY): Payer: Self-pay

## 2010-09-14 DIAGNOSIS — R110 Nausea: Secondary | ICD-10-CM | POA: Insufficient documentation

## 2010-09-14 DIAGNOSIS — R1031 Right lower quadrant pain: Secondary | ICD-10-CM | POA: Insufficient documentation

## 2010-09-14 LAB — COMPREHENSIVE METABOLIC PANEL
ALT: 15 U/L (ref 0–35)
CO2: 25 mEq/L (ref 19–32)
Calcium: 9.3 mg/dL (ref 8.4–10.5)
GFR calc non Af Amer: >60 mL/min (ref >60)
Glucose, Bld: 96 mg/dL (ref 70–99)
Sodium: 139 mEq/L (ref 135–145)
Total Bilirubin: 0.6 mg/dL (ref 0.3–1.2)

## 2010-09-14 LAB — DIFFERENTIAL
Basophils Relative: 1 % (ref 0–1)
Eosinophils Absolute: 0.1 10*3/uL (ref 0.0–0.7)
Eosinophils Relative: 2 % (ref 0–5)
Monocytes Absolute: 0.4 10*3/uL (ref 0.1–1.0)
Monocytes Relative: 7 % (ref 3–12)

## 2010-09-14 LAB — URINALYSIS, ROUTINE W REFLEX MICROSCOPIC
Glucose, UA: NEGATIVE mg/dL
Ketones, ur: NEGATIVE mg/dL
Leukocytes, UA: NEGATIVE
Nitrite: NEGATIVE
Protein, ur: NEGATIVE mg/dL

## 2010-09-14 LAB — CBC
MCH: 29.6 pg (ref 26.0–34.0)
MCHC: 35.6 g/dL (ref 30.0–36.0)
Platelets: 226 10*3/uL (ref 150–400)

## 2010-09-14 MED ORDER — IOHEXOL 300 MG/ML  SOLN
100.0000 mL | Freq: Once | INTRAMUSCULAR | Status: AC | PRN
  Administered 2010-09-14: 100 mL via INTRAVENOUS

## 2010-09-15 LAB — URINE CULTURE

## 2010-09-18 ENCOUNTER — Other Ambulatory Visit: Payer: Self-pay | Admitting: Obstetrics & Gynecology

## 2010-10-06 ENCOUNTER — Inpatient Hospital Stay (HOSPITAL_COMMUNITY)
Admission: RE | Admit: 2010-10-06 | Discharge: 2010-10-09 | DRG: 885 | Disposition: A | Discharge status: Home / Self Care | Payer: 59 | Attending: Psychiatry | Admitting: Psychiatry

## 2010-10-06 DIAGNOSIS — N809 Endometriosis, unspecified: Secondary | ICD-10-CM

## 2010-10-06 DIAGNOSIS — Z560 Unemployment, unspecified: Secondary | ICD-10-CM

## 2010-10-06 DIAGNOSIS — F331 Major depressive disorder, recurrent, moderate: Principal | ICD-10-CM

## 2010-10-06 DIAGNOSIS — IMO0001 Reserved for inherently not codable concepts without codable children: Secondary | ICD-10-CM

## 2010-10-06 DIAGNOSIS — IMO0002 Reserved for concepts with insufficient information to code with codable children: Secondary | ICD-10-CM

## 2010-10-06 DIAGNOSIS — R45851 Suicidal ideations: Secondary | ICD-10-CM

## 2010-10-06 DIAGNOSIS — Z818 Family history of other mental and behavioral disorders: Secondary | ICD-10-CM

## 2010-10-07 DIAGNOSIS — F339 Major depressive disorder, recurrent, unspecified: Secondary | ICD-10-CM
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
## 2010-10-20 NOTE — Assessment & Plan Note (Signed)
[facility]                             PULMONARY OFFICE NOTE   NAME:[name], April FAHEY                        MRN:          [id number]  DATE:10/13/2006                            DOB:          [birth year]    PULMONARY/FOLLOWUP OFFICE VISIT:   HISTORY:  A delightful 25 year old white female with new-onset asthma  that had failed to be controlled with Singulair now in the setting of  previous longstanding history of allergic rhinitis.  I had seen her on  September 05, 2006 and introduced her to the use of Qvar 40 two puffs b.i.d.  She says she has had no problems whatsoever; even during a bad cold,  she had no need for Xopenex.   She continues to use Singulair daily plus the Qvar 40 two puffs b.i.d.  with MDI technique that approaches 50% effectiveness.   PHYSICAL EXAMINATION:  She is a pleasant, ambulatory white female in no  acute distress.  She has stable vital signs.  HEENT:  Reveals severe turbinate edema bilaterally with watery  discharge.  Oropharynx is clear.  NECK:  Supple without cervical adenopathy or tenderness.  Trachea is  midline.  There is no thyromegaly.  LUNGS:  Fields are perfectly clear bilaterally to auscultation and  percussion.  There is a regular rhythm without murmur, rub or gallop.  ABDOMEN:  Soft and benign.  EXTREMITIES:  Warm without calf tenderness, cyanosis, clubbing or edema.   IMPRESSION:  No significant asthma on small doses of Qvar.  In fact, she  is doing so well she could probably reduce the dose down to 1 puff  b.i.d. for cough-saving purposes (acknowledging that it is really not  improved at such low doses, but titrating to the lowest effective dose  and remembering the rule of 2's for Xopenex use).   In terms of her rhinitis, she can certainly use as-needed Claritin, but  if the Singulair is not really needed for asthma and is not controlling  the rhinitis any better, it probably can be discontinued at this  point,  using the reversed therapeutic trial concept.  If she noticed  increased nasal symptoms and/or increased asthma symptoms off of  Singulair, then she should restart it.   Refills, however, can be through Dr. Roe Coombs Moore's office.  We will see  her here if her asthma control is an issue.    Charlaine Dalton. Sherene Sires, MD, [facility]  Electronically Signed   MBW/MedQ  DD: 10/13/2006  DT: 10/14/2006  Job #: [id number]   cc:   Ernestina Penna, M.D.

## 2010-10-20 NOTE — Cardiovascular Report (Signed)
April [name], April [name]                 ACCOUNT NO.:  [id number]   MEDICAL RECORD NO.:  [id number]          PATIENT TYPE:  OIB   LOCATION:  2854                         FACILITY:  [facility]   PHYSICIAN:  Richard A. Alanda Amass, M.D.DATE OF BIRTH:  [birth year]   DATE OF PROCEDURE:  DATE OF DISCHARGE:                            CARDIAC CATHETERIZATION   TILT TABLE TEST   [name] is a 25 year old single white woman who has a long-term  history of vasovagal-type syncope.  She also has a clinical history of  depression and is on Cymbalta and Lamictal.  There was a psychiatric  hospitalization for depression in February 2008.  She is a single white  woman with no children and is a nonsmoker.   She had demonstrated hypotension with stressful stimuli in the office  under the watchful eyes of Dr. Lenise Herald in the office with  relative hypotension and no significant bradycardia noted, similar to  her prior events by history.  There is a prior history of asthma which  is not active and a history of migraines with a positive bubble study  for PFO noted on outpatient 2-D echo with no significant valvular  disease and normal LV function.   The patient was brought in for tilt table testing off medications.   Informed consent was obtained to proceed with this.  The patient was  kept in the supine position for 5 to 10 minutes, monitoring blood  pressure and pulse with a baseline pulse of 65 to 75 and baseline blood  pressure of 107/50.  She was then tilted to 70 degrees for a total of 35  minutes.  There were no symptoms during this period of time.  Blood  pressure remained stable at 100 to 110 systolic, and heart rate  did,  however, increase from 65 to 100-110.  There was no hypotension or  bradycardia during upright tilt table testing for 35 minutes.   The patient was then placed in the supine position for stabilization.  Her heart rate came back down to the 80 to 90 range, and Isuprel was  then  given at 1 mcg/kg and then after 2 minutes increased to 2 mcg/kg.  During this period of time, heart rate increased to 120 per minute  without any hypotension.  She then had a sudden increase in the heart  rate to 160 with sinus tachycardia.  She felt dizzy at this time and we  put her back down to a supine position and discontinued her Isuprel  because of overshooting of her target heart rate.  She had no syncope or  presyncope.  She came down to the 80 range and blood pressure remained  over 100 during this Isuprel infusion.   This was a negative upright tilt table test for any cardiac symptoms or  vasodepressor reaction or bradycardia and negative tilt test with  Isuprel infusion.   The patient tolerated the procedure well.  We will hold her for several  hours for observation and if stable allow her to go home with followup  with Dr. Jenne Campus.  She may be  a candidate to consider low-dose beta  blockade therapy since she is already on an SSRI.  Also  adequate  hydration and avoidance of stressful situations may be helpful.      Richard A. Alanda Amass, M.D.  Electronically Signed     RAW/MEDQ  D:  07/25/2007  T:  07/25/2007  Job:  [id number]   cc:   Darlin Priestly, MD

## 2010-10-23 NOTE — H&P (Signed)
NAME:  April [name], April [name]                           ACCOUNT NO.:  [id number]   MEDICAL RECORD NO.:  [id number]                   PATIENT TYPE:  AMB   LOCATION:  SDC                                  FACILITY:  [facility]   PHYSICIAN:  Lenoard Aden, M.D.             DATE OF BIRTH:  April 19, 1986   DATE OF ADMISSION:  01/29/2004  DATE OF DISCHARGE:                                HISTORY & PHYSICAL   CHIEF COMPLAINT:  Right lower quadrant pain.   HISTORY OF PRESENT ILLNESS:  Patient is a 25 year old white female with a 3  to 4 week history of persistent right lower quadrant pain with an otherwise  normal workup.  Her workup has included a normal gastrointestinal workup,  negative STD workup, negative pregnancy test, cystoscopy with urology workup  and negative findings, negative pelvic ultrasound, negative pelvic CT scan  with normal appendix noted.   Her gynecologic history is remarkable for heavy periods and painful  __________ .  Family history is remarkable for endometriosis.  Patient is  currently sexually active.  She denies dyspareunia.  Admission medications  include amitriptyline and Ortho Tri-Cyclen Lo, Wellbutrin, and fluoxetine.  Medical problems also to include depression.  Surgical history also  remarkable for a ganglion cyst removal.  Family history as noted for  endometriosis, breast cancer and diabetes.   PHYSICAL EXAMINATION:  GENERAL:  She is a well-developed, well-nourished  white female no acute distress.  HEENT:  Normal.  LUNGS:  Clear.  HEART:  Regular rate and rhythm.  ABDOMEN:  Soft, scaphoid, no rebound or guarding is noted.  No cervical  motion tenderness is noted.  CERVIX:  Uterus is small, anteflexed, no adnexal masses.  EXTREMITIES:  No cords.  NEUROLOGIC:  Nonfocal.   IMPRESSION:  Right lower quadrant pain of unknown etiology with strong  family history of endometriosis in a first-degree relative.   PLAN:  Proceed with diagnostic laparoscopy, possible  ablation of  endometriosis, risks of anesthesia, infection, bleeding, injury to abdominal  organs with need for repairs, discussed delayed versus immediate  complications to include bowel and bladder injury noted, inability to cure  pelvic pain discussed.  Patient acknowledges and wishes to proceed.                                               Lenoard Aden, M.D.    RJT/MEDQ  D:  01/28/2004  T:  01/28/2004  Job:  [id number]   cc:   [facility]

## 2010-10-23 NOTE — Op Note (Signed)
NAME:  April [name], April [name]                           ACCOUNT NO.:  [id number]   MEDICAL RECORD NO.:  [id number]                   PATIENT TYPE:  AMB   LOCATION:  SDC                                  FACILITY:  [facility]   PHYSICIAN:  Lenoard Aden, M.D.             DATE OF BIRTH:  01-Aug-1985   DATE OF PROCEDURE:  01/29/2004  DATE OF DISCHARGE:                                 OPERATIVE REPORT   PREOPERATIVE DIAGNOSES:  Right lower quadrant pain.   POSTOPERATIVE DIAGNOSES:  Pelvic endometriosis.   PROCEDURE:  Diagnostic laparoscopy, excision of cul-de-sac, endometriosis,  ablation of left ovarian endometriosis.   SURGEON:  Lenoard Aden, M.D.   ANESTHESIA:  General.   ESTIMATED BLOOD LOSS:  Less than 50 mL.   COMPLICATIONS:  None.   DRAINS:  None.   COUNTS:  Correct.   The patient went to the recovery room in good condition.   DESCRIPTION OF PROCEDURE:  After being apprised of the risks of anesthesia,  infection, bleeding, injury to abdominal organs, need for repair, the  patient was brought to the operating room where she was administered a  general anesthetic without complications.  Prepped and draped in the usual  sterile fashion, catheterized until the bladder was empty after achieving  adequate anesthesia and dilute Marcaine solution placed in the area of the  umbilical incision. A small skin incision made, Veress needle placed,  opening pressure of -1 noted.  3.5 liters of CO2 insufflated without  difficulty.  A 5 mm trocar placed suprapubically, visualization reveals  atraumatic trocar entry, normal liver and gallbladder bed, normal appendix,  normal diaphragm.  Uterus normal size, shape and contour, normal tubes, left  ovarian endometriosis noted with a red vesicular lesion at the superior pole  of the left ovary, no anterior cul-de-sac endometriosis, posterior cul-de-  sac on the right is infiltrated with a large Masters window containing  endometriosis. The right  ureter is traced out and then found to be well  lateral to the area of endometriosis which is then elevated using a grasper  and excised window in its entirety at the base using monopolar __________.  Bleeding is controlled with Kleppinger bipolar cautery, irrigation is  accomplished.  Prior to resecting this, peritoneal cytology is performed  aspirating about 10 mL of serosanguineous fluid from the pelvis. At this  time, good hemostasis is achieved, no evidence of bleeding is noted. The  right ureter is peristalsing within normal limits. The left ovary is  approached, grasped with the ovarian ligament and the left implant on the  ovary is cauterized and removed without difficulty.  No evidence of further  endometriosis is noted.  Irrigation is accomplished, CO2 is  released, no bleeding noted. All instruments removed from the abdomen, CO2  released, incision is closed using a #0 Vicryl and Dermabond.  Instruments  removed from the vagina. The patient tolerated the procedure well  and was  transferred to the recovery room in good condition.                                               Lenoard Aden, M.D.    RJT/MEDQ  D:  01/29/2004  T:  01/29/2004  Job:  [id number]

## 2010-10-23 NOTE — Assessment & Plan Note (Signed)
[facility]                             PULMONARY OFFICE NOTE   NAME:[name], [name]                        MRN:          [id number]  DATE:09/05/2006                            DOB:          1986-01-23    CHIEF COMPLAINT:  Asthma.   HISTORY:  A 25 year old white female who caught a bad cold in  September of 2007 and has had a persistent cough since that time.  It is  worse when she lays down and early in the morning and made better by  albuterol.   She does have a history of allergies, but has never seen an allergist  and typically just has problems in the Spring and in the Fall with  itching eyes, dripping nose, which she denies correlate with her present  symptoms of cough.  She denies any excessive sputum production, chest  pain, fevers, chills, sweats, overt sinus or reflux symptoms.   PAST MEDICAL HISTORY:  Significant for minor surgery only and  fibromyalgia.   ALLERGIES:  CODEINE CAUSES A BAD REACTION.   MEDICATIONS:  Include:  1. Wellbutrin.  2. Singulair.  3. Risperdal.  4. Topamax.  5. Cymbalta.   She is not convinced that the Singulair does anything, but overall is  a bit better since she started it about 4 weeks ago.   SOCIAL HISTORY:  She has never smoked.  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   FAMILY HISTORY:  Significant for allergies in her maternal grandmother,  mother and sister.   REVIEW OF SYSTEMS:  Taken in detail on the work sheet.  Significant for  problems as outlined above.   PHYSICAL EXAMINATION:  This is a pleasant, ambulatory white female in no  acute distress.  She has stable vital signs.  HEENT:  Mild to moderate turbinate edema, nonspecific features.  Oropharynx is clear.  No evidence of post nasal drainage or  cobblestoning.  Dentition is intact.  Ear canals clear bilaterally.  NECK:  Supple without cervical adenopathy tenderness.  Trachea is  midline without  thyromegaly.  LUNGS:  Lung fields are perfectly clear bilaterally to auscultation and  percussion.  There is excellent air movement with no cough on  inspiratory or expiratory maneuvers.  HEART:  Regular rhythm without murmur or rub.  ABDOMEN:  Soft, benign.  EXTREMITIES:  Without calf tenderness, cyanosis, clubbing or edema.   Chest x-ray was reviewed from August 01, 2006 and is normal.   IMPRESSION:  1. Probable asthma suggested by nocturnal cough that is improved with      Xopenex.  Note that although she is not convinced Singulair is      helping her, it may be that she did not appreciate the delayed      effect in terms of benefit.  It also may be that whatever is      causing her asthma is waxing and waning in terms of intensity.  2. I also note that she has significant allergic rhinitis history, for      which Singulair may  actually be an excellent choice through the      Spring.   For those reasons, I asked her to continue the Singulair, but add Qvar  40 mg 2 puffs b.i.d. to her regimen with a follow-up methacholine  challenge test if not convinced that we are on the right track.   I reviewed the main differential diagnosis for chronic cough with her  and emphasized a 14 step process may be necessary to totally eradicate  the cough, but for now we will empirically treat with Qvar to see how  she does.   Extra time was spent explaining this plus optimal MDI technique to make  sure that she got good penetration of the drug to the lower airway and  mimimize deposition in the upper airway.     Charlaine Dalton. Sherene Sires, MD, [facility]  Electronically Signed    MBW/MedQ  DD: 09/05/2006  DT: 09/06/2006  Job #: [id number]

## 2010-10-23 NOTE — Discharge Summary (Signed)
April, [name]                 ACCOUNT NO.:  [id number]   MEDICAL RECORD NO.:  [id number]          PATIENT TYPE:  IPS   LOCATION:  0305                          FACILITY:  [facility]   PHYSICIAN:  Anselm Jungling, MD  DATE OF BIRTH:  Mar 12, 1986   DATE OF ADMISSION:  07/18/2006  DATE OF DISCHARGE:  07/21/2006                               DISCHARGE SUMMARY   IDENTIFYING DATA AND REASON FOR ADMISSION:  This was an inpatient  psychiatric admission for April [name],  a 25 year old single white female who  is working, and lives with her parents.  She came to [name] as a patient of  [name], [employer]or, [employer], and her therapist, [name].  She came to [name] on a regimen of Prozac, Wellbutrin, Risperdal, and  Topamax.  She had come in at the suggestion of these providers, due to  increasing suicidal ideation and some self-inflicted cutting.  Recent  stressors included the death of a pet dog.  Please refer to the  admission note for further details pertaining to the symptoms,  circumstances and history that led to her hospitalization.  She was  given an initial Axis I diagnosis of major depressive disorder,  recurrent, and rule out Axis II personality traits.   MEDICAL AND LABORATORY:  The patient presented with cough and what she  described as sinus pain.  She denied recent exposure to influenza.  She was evaluated by the psychiatric nurse practitioner, and supportive  measures such as lozenges, Tylenol, and Claritin were ordered.  Her  symptoms resolved during the course of her brief inpatient stay.   HOSPITAL COURSE:  The patient was admitted the adult inpatient  psychiatric service.  She presented as a well-nourished, well-developed  young woman who was very pleasant and well-organized.  She was alert,  fully oriented, and there were no signs or symptoms of psychosis or  thought disturbance.  Her mood was depressed with sad affect.  She  denied any suicidal ideation and verbalized a  strong desire for help.   She was continued on her usual regimen of Prozac, Wellbutrin, Risperdal,  and Topamax.  She was involved in therapeutic groups and activities and  was a good participant in treatment program.   There was a family session involving her parents, in which the reasons  for admission, the patient's hospital treatment, and aftercare needs  were discussed at length.  Her parents were very supportive.  The  patient indicated the following day that she felt ready for discharge,  and her parents were in concurrence.  She agreed to the following  aftercare plan.   AFTERCARE:  The patient was to follow up with [name] on the  day of discharge, July 21, 2006, and with [name] on the day after  discharge, July 22, 2006.   DISCHARGE MEDICATIONS:  1. Prozac 40 mg daily.  2. Wellbutrin XL 300 mg daily.  3. Risperdal 1 mg nightly.  4. Topamax 75 mg nightly.   DISCHARGE DIAGNOSES:  AXIS I:  Major depressive disorder, recurrent, and  bereavement.  AXIS II:  Deferred.  AXIS III:  Recent upper respiratory infection.  AXIS IV:  Stressors severe.  AXIS V:  Global assessment of functioning on discharge 70.      Anselm Jungling, MD  Electronically Signed     SPB/MEDQ  D:  07/22/2006  T:  07/23/2006  Job:  [id number]

## 2011-01-26 ENCOUNTER — Other Ambulatory Visit: Payer: Self-pay | Admitting: Internal Medicine

## 2011-01-29 ENCOUNTER — Emergency Department (HOSPITAL_COMMUNITY)
Admission: EM | Admit: 2011-01-29 | Discharge: 2011-01-29 | Disposition: A | Discharge status: Home / Self Care | Payer: 59 | Attending: Emergency Medicine | Admitting: Emergency Medicine

## 2011-01-29 ENCOUNTER — Emergency Department (HOSPITAL_COMMUNITY): Payer: 59

## 2011-01-29 DIAGNOSIS — R404 Transient alteration of awareness: Secondary | ICD-10-CM | POA: Insufficient documentation

## 2011-01-29 DIAGNOSIS — S139XXA Sprain of joints and ligaments of unspecified parts of neck, initial encounter: Secondary | ICD-10-CM | POA: Insufficient documentation

## 2011-01-29 DIAGNOSIS — M542 Cervicalgia: Secondary | ICD-10-CM | POA: Insufficient documentation

## 2011-01-29 DIAGNOSIS — R209 Unspecified disturbances of skin sensation: Secondary | ICD-10-CM | POA: Insufficient documentation

## 2011-01-29 DIAGNOSIS — R51 Headache: Secondary | ICD-10-CM | POA: Insufficient documentation

## 2011-02-26 LAB — HCG, SERUM, QUALITATIVE: Preg, Serum: NEGATIVE

## 2011-03-04 LAB — POCT I-STAT, CHEM 8
Chloride: 105
Glucose, Bld: 90
HCT: 37
Hemoglobin: 12.6
Potassium: 4.6
Sodium: 137

## 2011-03-05 LAB — POCT RAPID STREP A: Streptococcus, Group A Screen (Direct): NEGATIVE

## 2011-03-09 ENCOUNTER — Emergency Department (HOSPITAL_COMMUNITY)
Admission: EM | Admit: 2011-03-09 | Discharge: 2011-03-10 | Disposition: A | Discharge status: Other Acute Inpatient Hospital | Payer: 59 | Attending: Emergency Medicine | Admitting: Emergency Medicine

## 2011-03-09 DIAGNOSIS — F329 Major depressive disorder, single episode, unspecified: Secondary | ICD-10-CM | POA: Insufficient documentation

## 2011-03-09 DIAGNOSIS — F3289 Other specified depressive episodes: Secondary | ICD-10-CM | POA: Insufficient documentation

## 2011-03-09 DIAGNOSIS — X789XXA Intentional self-harm by unspecified sharp object, initial encounter: Secondary | ICD-10-CM | POA: Insufficient documentation

## 2011-03-09 DIAGNOSIS — IMO0002 Reserved for concepts with insufficient information to code with codable children: Secondary | ICD-10-CM | POA: Insufficient documentation

## 2011-03-09 DIAGNOSIS — R45851 Suicidal ideations: Secondary | ICD-10-CM | POA: Insufficient documentation

## 2011-03-09 LAB — ETHANOL: Alcohol, Ethyl (B): <11 mg/dL (ref 0–11)

## 2011-03-09 LAB — CBC
HCT: 34.8 — ABNORMAL LOW
Hemoglobin: 12.2
Hemoglobin: 12.4 g/dL (ref 12.0–15.0)
MCH: 29.9 pg (ref 26.0–34.0)
MCHC: 35
MCHC: 36.3 g/dL — ABNORMAL HIGH (ref 30.0–36.0)
MCV: 91
Platelets: 232
Platelets: 259
RBC: 3.83 — ABNORMAL LOW
RDW: 12.7
RDW: 13
RDW: 14.9 % (ref 11.5–15.5)
WBC: 5.3
WBC: 5.5

## 2011-03-09 LAB — POCT PREGNANCY, URINE
Preg Test, Ur: NEGATIVE
Preg Test, Ur: NEGATIVE

## 2011-03-09 LAB — DIFFERENTIAL
Basophils Absolute: 0.1
Basophils Relative: 1
Basophils Relative: 2 — ABNORMAL HIGH
Basophils Relative: 1 % (ref 0–1)
Eosinophils Absolute: 0.2
Eosinophils Absolute: 0.2
Eosinophils Relative: 4
Eosinophils Relative: 4
Eosinophils Relative: 1 % (ref 0–5)
Lymphocytes Relative: 46
Lymphs Abs: 2.5
Monocytes Absolute: 0.4
Monocytes Absolute: 0.4
Monocytes Absolute: 0.3 10*3/uL (ref 0.1–1.0)
Monocytes Relative: 7
Monocytes Relative: 7
Monocytes Relative: 5 % (ref 3–12)
Neutro Abs: 2.3
Neutro Abs: 4.3 10*3/uL (ref 1.7–7.7)
Neutrophils Relative %: 41 — ABNORMAL LOW

## 2011-03-09 LAB — BASIC METABOLIC PANEL
CO2: 23 mEq/L (ref 19–32)
Calcium: 9.4 mg/dL (ref 8.4–10.5)
Creatinine, Ser: 0.76 mg/dL (ref 0.50–1.10)
Glucose, Bld: 81 mg/dL (ref 70–99)

## 2011-03-09 LAB — URINALYSIS, ROUTINE W REFLEX MICROSCOPIC
Glucose, UA: NEGATIVE mg/dL
Ketones, ur: 40 mg/dL — AB
Nitrite: NEGATIVE
Protein, ur: NEGATIVE
Specific Gravity, Urine: 1.024
Urobilinogen, UA: 0.2
pH: 6.5 (ref 5.0–8.0)

## 2011-03-09 LAB — GC/CHLAMYDIA PROBE AMP, GENITAL
Chlamydia, DNA Probe: NEGATIVE
GC Probe Amp, Genital: NEGATIVE

## 2011-03-09 LAB — COMPREHENSIVE METABOLIC PANEL
ALT: 16
AST: 18
AST: 26
Albumin: 3.1 — ABNORMAL LOW
Albumin: 3.6
Alkaline Phosphatase: 56
Alkaline Phosphatase: 60
BUN: 9
Chloride: 100
Chloride: 107
GFR calc Af Amer: >60
Potassium: 3.6
Potassium: 3.9
Sodium: 139
Total Bilirubin: 0.5
Total Bilirubin: 0.7
Total Protein: 5.8 — ABNORMAL LOW
Total Protein: 6.6

## 2011-03-09 LAB — URINE MICROSCOPIC-ADD ON

## 2011-03-09 LAB — COMPREHENSIVE METABOLIC PANEL WITH GFR
ALT: 16
CO2: 25
Calcium: 8.2 — ABNORMAL LOW
Creatinine, Ser: 0.67
GFR calc non Af Amer: >60
Glucose, Bld: 85
Sodium: 131 — ABNORMAL LOW

## 2011-03-09 LAB — WET PREP, GENITAL
Trich, Wet Prep: NONE SEEN
Yeast Wet Prep HPF POC: NONE SEEN

## 2011-03-09 LAB — RAPID URINE DRUG SCREEN, HOSP PERFORMED
Benzodiazepines: POSITIVE — AB
Cocaine: NOT DETECTED

## 2011-03-09 LAB — RPR: RPR Ser Ql: NONREACTIVE

## 2012-10-02 ENCOUNTER — Telehealth: Payer: Self-pay | Admitting: Family Medicine

## 2012-10-02 MED ORDER — TRAZODONE HCL 50 MG PO TABS: 50.0000 mg | ORAL_TABLET | Freq: Every day | ORAL | Status: DC

## 2012-10-02 MED ORDER — OMEPRAZOLE 40 MG PO CPDR: 40.0000 mg | DELAYED_RELEASE_CAPSULE | Freq: Every day | ORAL | Status: DC

## 2012-10-02 NOTE — Telephone Encounter (Signed)
Rx Refilled  

## 2012-12-06 ENCOUNTER — Telehealth: Payer: Self-pay | Admitting: Family Medicine

## 2012-12-06 NOTE — Telephone Encounter (Signed)
Due for ROV (TPickard). Schedule OV

## 2012-12-06 NOTE — Telephone Encounter (Signed)
LMTRC

## 2012-12-06 NOTE — Telephone Encounter (Signed)
?  ok to refill °

## 2012-12-11 NOTE — Telephone Encounter (Signed)
LMTRC

## 2012-12-12 ENCOUNTER — Encounter: Payer: Self-pay | Admitting: Family Medicine

## 2012-12-12 NOTE — Telephone Encounter (Signed)
No response from patient.  Letter was sent to call and schedule appt

## 2013-02-08 ENCOUNTER — Ambulatory Visit (INDEPENDENT_AMBULATORY_CARE_PROVIDER_SITE_OTHER): Payer: 59 | Admitting: Family Medicine

## 2013-02-08 ENCOUNTER — Encounter: Payer: Self-pay | Admitting: Family Medicine

## 2013-02-08 VITALS — BP 110/68 | HR 68 | Temp 98.2°F | Resp 18 | Wt 168.0 lb

## 2013-02-08 DIAGNOSIS — R5383 Other fatigue: Secondary | ICD-10-CM

## 2013-02-08 DIAGNOSIS — F329 Major depressive disorder, single episode, unspecified: Secondary | ICD-10-CM | POA: Insufficient documentation

## 2013-02-08 DIAGNOSIS — F32A Depression, unspecified: Secondary | ICD-10-CM | POA: Insufficient documentation

## 2013-02-08 DIAGNOSIS — R5381 Other malaise: Secondary | ICD-10-CM

## 2013-02-08 DIAGNOSIS — E039 Hypothyroidism, unspecified: Secondary | ICD-10-CM

## 2013-02-08 DIAGNOSIS — E038 Other specified hypothyroidism: Secondary | ICD-10-CM

## 2013-02-08 DIAGNOSIS — M797 Fibromyalgia: Secondary | ICD-10-CM | POA: Insufficient documentation

## 2013-02-08 NOTE — Progress Notes (Signed)
  Subjective:    Patient ID: April [name], female    DOB: Apr 01, 1986, 27 y.o.   MRN: [id number]  HPI  Patient presents today complaining of severe fatigue and malaise.  She has a history of subclinical hypothyroidism. She is ever did recheck a TSH. She also has a history of mild anemia and she is overdue to recheck a CBC. Otherwise she is doing well. She reports her generalized aches and pains due to fibromyalgia. She also has depression and insomnia. Currently the insomnia is well controlled with trazodone. She also has gastroesophageal reflux disease which is currently well controlled with omeprazole. She denies any nausea vomiting diarrhea or melena. She denies any fevers or chills. She denies any chest pain shortness of breath or dyspnea on exertion. Past Medical History  Diagnosis Date  . Subclinical hypothyroidism   . Fibromyalgia syndrome   . Depression    Current Outpatient Prescriptions on File Prior to Visit  Medication Sig Dispense Refill  . omeprazole (PRILOSEC) 40 MG capsule Take 1 capsule (40 mg total) by mouth daily.  30 capsule  11  . traZODone (DESYREL) 50 MG tablet Take 1 tablet (50 mg total) by mouth at bedtime.  30 tablet  1   No current facility-administered medications on file prior to visit.   Allergies  Allergen Reactions  . Aripiprazole   . Codeine   . Lamotrigine   . Zolpidem Tartrate    History   Social History  . Marital Status: Single    Spouse Name: N/A    Number of Children: N/A  . Years of Education: N/A   Occupational History  . Not on file.   Social History Main Topics  . Smoking status: Never Smoker   . Smokeless tobacco: Not on file  . Alcohol Use: No  . Drug Use: No  . Sexual Activity: Not on file   Other Topics Concern  . Not on file   Social History Narrative  . No narrative on file     Review of Systems  All other systems reviewed and are negative.       Objective:   Physical Exam  Vitals reviewed. Constitutional:  She appears well-developed and well-nourished.  Neck: Neck supple. No JVD present. No thyromegaly present.  Cardiovascular: Normal rate, regular rhythm and normal heart sounds.  Exam reveals no gallop and no friction rub.   No murmur heard. Pulmonary/Chest: Effort normal and breath sounds normal. No respiratory distress. She has no wheezes. She has no rales. She exhibits no tenderness.  Abdominal: Soft. Bowel sounds are normal. She exhibits no distension and no mass. There is no tenderness. There is no rebound and no guarding.  Lymphadenopathy:    She has no cervical adenopathy.          Assessment & Plan:  1. Subclinical hypothyroidism Patient is now symptomatic with fatigue. Her TSH is still elevated I would recommend low-dose levothyroxine 25 mcg by mouth daily. Also check a CBC. The patient is anemic I would recommend iron sulfate 325 mg by mouth daily. - COMPLETE METABOLIC PANEL WITH GFR - TSH - CBC with Differential  2. Other malaise and fatigue - COMPLETE METABOLIC PANEL WITH GFR - TSH - CBC with Differential  Patient's other chronic medical conditions are stable and I will refill her medications as needed.

## 2013-02-09 LAB — TSH: TSH: 1.587 u[IU]/mL (ref 0.350–4.500)

## 2013-02-09 LAB — CBC WITH DIFFERENTIAL/PLATELET
Basophils Relative: 1 % (ref 0–1)
HCT: 32.6 % — ABNORMAL LOW (ref 36.0–46.0)
Hemoglobin: 11.2 g/dL — ABNORMAL LOW (ref 12.0–15.0)
Lymphs Abs: 1.6 10*3/uL (ref 0.7–4.0)
MCHC: 34.4 g/dL (ref 30.0–36.0)
Monocytes Absolute: 0.3 10*3/uL (ref 0.1–1.0)
Monocytes Relative: 7 % (ref 3–12)
Neutro Abs: 1.8 10*3/uL (ref 1.7–7.7)
Neutrophils Relative %: 49 % (ref 43–77)
RBC: 3.98 MIL/uL (ref 3.87–5.11)

## 2013-02-09 LAB — COMPLETE METABOLIC PANEL WITH GFR
AST: 16 U/L (ref 0–37)
Albumin: 3.2 g/dL — ABNORMAL LOW (ref 3.5–5.2)
Alkaline Phosphatase: 54 U/L (ref 39–117)
BUN: 14 mg/dL (ref 6–23)
Potassium: 4.2 mEq/L (ref 3.5–5.3)
Sodium: 137 mEq/L (ref 135–145)
Total Bilirubin: 0.5 mg/dL (ref 0.3–1.2)
Total Protein: 6 g/dL (ref 6.0–8.3)

## 2013-07-03 ENCOUNTER — Encounter: Payer: Self-pay | Admitting: Internal Medicine

## 2013-07-03 ENCOUNTER — Ambulatory Visit (INDEPENDENT_AMBULATORY_CARE_PROVIDER_SITE_OTHER): Payer: Medicare Other | Admitting: Internal Medicine

## 2013-07-03 VITALS — BP 108/60 | HR 87 | Temp 98.0°F | Resp 12 | Ht 66.0 in | Wt 178.4 lb

## 2013-07-03 DIAGNOSIS — R5381 Other malaise: Secondary | ICD-10-CM

## 2013-07-03 DIAGNOSIS — R5383 Other fatigue: Secondary | ICD-10-CM

## 2013-07-03 DIAGNOSIS — E039 Hypothyroidism, unspecified: Secondary | ICD-10-CM

## 2013-07-03 DIAGNOSIS — E038 Other specified hypothyroidism: Secondary | ICD-10-CM

## 2013-07-03 NOTE — Progress Notes (Signed)
Patient ID: April [name], female   DOB: 01-14-1986, 28 y.o.   MRN: [id number]   HPI  April [name] is a 28 y.o.-year-old female, self-referred, for evaluation for hypothyroidism. She just moved from [place].   Pt. has been dx with Hashimotos thyroiditis in 12/2012, by psychiatry after she c/o depression, body aches and fatigue >> TSH high, but unclear how high. She also had anemia at that time >> started on iron and vitamin C.   I reviewed pt's thyroid tests: Lab Results  Component Value Date   TSH 1.587 02/08/2013    Pt denies feeling nodules in neck, hoarseness, dysphagia/odynophagia, SOB with lying down.  Pt describes: - + both heat cold intolerance - + weight gain alternating with loss - +  fatigue - + constipation and nausea - + hair falling - + depression - + bilateral hip pain   She has + FH of thyroid disorders in aunt. No FH of thyroid cancer.   Sister with Cushing ds >> s/p TSR.  No h/o radiation tx to head or neck.  I reviewed her chart and she also has a history of fibromyalgia and chronic fatigue at 28 y/o ([facility] She also had vaso-vagal hypotension >> improved after her 9th grade - npow restarting as she is more active. Also has a h/o anemia. She was tested for adrenal insufficiency and Cushing ds and this was negative.  ROS: Constitutional: + see HPI Eyes: no blurry vision, no xerophthalmia ENT: + sore throat, no nodules palpated in throat, no dysphagia/odynophagia, no hoarseness Cardiovascular: no CP/SOB/palpitations/leg swelling Respiratory: no cough/SOB Gastrointestinal: + N/+ V/no D/+ C/+ reflux Musculoskeletal: + both muscle/joint aches Skin: no rashes, + hair loss, + easy bruising Neurological: no tremors/numbness/tingling/dizziness Psychiatric: no depression/anxiety  Past Medical History  Diagnosis Date  . Subclinical hypothyroidism   . Fibromyalgia syndrome   . Depression   . Anemia    History reviewed. No pertinent past  surgical history. History   Social History  . Marital Status: Single    Spouse Name: N/A    Number of Children: 0   Occupational History  . Artist/printmaker   Social History Main Topics  . Smoking status: Never Smoker   . Smokeless tobacco: Not on file  . Alcohol Use: No  . Drug Use: No   Current Outpatient Prescriptions on File Prior to Visit  Medication Sig Dispense Refill  . buPROPion (WELLBUTRIN XL) 300 MG 24 hr tablet Take 300 mg by mouth daily.      Marland Kitchen. etonogestrel-ethinyl estradiol (NUVARING) 0.12-0.015 MG/24HR vaginal ring Place 1 each vaginally every 28 (twenty-eight) days. Insert vaginally and leave in place for 3 consecutive weeks, then remove for 1 week.      Marland Kitchen. FLUoxetine (PROZAC) 40 MG capsule Take 40 mg by mouth daily.      Marland Kitchen. omeprazole (PRILOSEC) 40 MG capsule Take 1 capsule (40 mg total) by mouth daily.  30 capsule  11  . traZODone (DESYREL) 50 MG tablet Take 1 tablet (50 mg total) by mouth at bedtime.  30 tablet  1   No current facility-administered medications on file prior to visit.   Allergies  Allergen Reactions  . Aripiprazole   . Codeine   . Lamotrigine   . Zolpidem Tartrate    Family History  Problem Relation Age of Onset  . Depression Mother    PE: BP 108/60  Pulse 87  Temp(Src) 98 F (36.7 C) (Oral)  Resp 12  Ht 5\' 6"  (1.676  m)  Wt 178 lb 6.4 oz (80.922 kg)  BMI 28.81 kg/m2  SpO2 98% Wt Readings from Last 3 Encounters:  07/03/13 178 lb 6.4 oz (80.922 kg)  02/08/13 168 lb (76.204 kg)  09/22/09 180 lb (81.647 kg)   Constitutional: overweight, in NAD Eyes: PERRLA, EOMI, no exophthalmos ENT: moist mucous membranes, no thyromegaly, no cervical lymphadenopathy Cardiovascular: RRR, No MRG Respiratory: CTA B Gastrointestinal: abdomen soft, NT, ND, BS+ Musculoskeletal: no deformities, strength intact in all 4 Skin: moist, warm, no rashes Neurological: + tremor with outstretched hands, DTR normal in all 4  ASSESSMENT: 1. Hashimoto's  thyroiditis - ? If euthyroid  2. Fatigue  PLAN:  1. Patient with newly diagnosed Hashimoto's thyroiditis - recent tests or previous Abx titers not available for review. She appears euthyroid. - She does not appear to have a goiter, thyroid nodules, or neck compression symptoms - we'll check thyroid tests today: TSH, free T4, free T3 - If these are abnormal, she will need to return in 6-8 weeks for repeat labs - If these are normal, she will be followed by PCP and she will come back if tests become abnormal - we will need to obtain her records from [place]  2. Fatigue - We will also check a vitamin D and a vitamin B12 - advised to continue iron and add magnesium to help with constipation - advised to try a gluten-free diet to see if helps    Office Visit on 07/03/2013  Component Date Value Range Status  . TSH 07/03/2013 0.62  0.35 - 5.50 uIU/mL Final  . Free T4 07/03/2013 0.90  0.60 - 1.60 ng/dL Final  . T3, Free 16/03/9603 3.2  2.3 - 4.2 pg/mL Final  . Thyroid Peroxidase Antibody 07/03/2013 <10.0  <35.0 IU/mL Final   Comment:                            The thyroid microsomal antigen has been shown to be Thyroid                          Peroxidase (TPO).  This assay detects anti-TPO antibodies.  . Vit D, 25-Hydroxy 07/03/2013 40  30 - 89 ng/mL Final   Comment: This assay accurately quantifies Vitamin D, which is the sum of the                          25-Hydroxy forms of Vitamin D2 and D3.  Studies have shown that the                          optimum concentration of 25-Hydroxy Vitamin D is 30 ng/mL or higher.                           Concentrations of Vitamin D between 20 and 29 ng/mL are considered to                          be insufficient and concentrations less than 20 ng/mL are considered                          to be deficient for Vitamin D.  . Vitamin B-12 07/03/2013 576  211 - 911 pg/mL Final   All normal.

## 2013-07-03 NOTE — Patient Instructions (Signed)
We will schedule an appointment if the labs are abnormal. If they are normal, you can follow up with your PCP and return to me as needed. I recommend to thyroid tests (TSH, free t4) every year.   Please stop at the lab.  We will call you with the results as soon as they are available.  Please try the gluten free diet.

## 2013-07-04 ENCOUNTER — Encounter: Payer: Self-pay | Admitting: Internal Medicine

## 2013-07-04 LAB — VITAMIN D 25 HYDROXY (VIT D DEFICIENCY, FRACTURES): VIT D 25 HYDROXY: 40 ng/mL (ref 30–89)

## 2013-07-04 LAB — T3, FREE: T3, Free: 3.2 pg/mL (ref 2.3–4.2)

## 2013-07-04 LAB — THYROID PEROXIDASE ANTIBODY: Thyroperoxidase Ab SerPl-aCnc: <10 IU/mL (ref <35.0)

## 2013-07-04 LAB — VITAMIN B12: VITAMIN B 12: 576 pg/mL (ref 211–911)

## 2013-07-04 LAB — T4, FREE: Free T4: 0.9 ng/dL (ref 0.60–1.60)

## 2013-07-04 LAB — TSH: TSH: 0.62 u[IU]/mL (ref 0.35–5.50)

## 2013-07-14 ENCOUNTER — Other Ambulatory Visit: Payer: Self-pay | Admitting: Physician Assistant

## 2013-07-16 NOTE — Telephone Encounter (Signed)
Medication refilled per protocol. 

## 2013-09-14 ENCOUNTER — Other Ambulatory Visit: Payer: Self-pay | Admitting: Family Medicine

## 2013-09-14 NOTE — Telephone Encounter (Signed)
Refill appropriate and filled per protocol. 

## 2013-09-21 ENCOUNTER — Other Ambulatory Visit: Payer: Self-pay | Admitting: Family Medicine

## 2013-09-24 NOTE — Telephone Encounter (Signed)
?   OK to Refill  

## 2013-09-24 NOTE — Telephone Encounter (Signed)
ok 

## 2013-09-26 ENCOUNTER — Other Ambulatory Visit: Payer: Self-pay | Admitting: Family Medicine

## 2013-09-26 MED ORDER — TRAZODONE HCL 50 MG PO TABS: ORAL_TABLET | ORAL | Status: DC

## 2013-10-23 ENCOUNTER — Other Ambulatory Visit: Payer: Self-pay | Admitting: Family Medicine

## 2014-01-28 ENCOUNTER — Telehealth: Payer: Self-pay | Admitting: Family Medicine

## 2014-01-28 NOTE — Telephone Encounter (Signed)
Patient is calling and dr pickard was standing in front of me when she called. She is moving to a new doctor in [place] and is asking for enough of her medications until oct 21st when she sees the new doc. The meds are trazodone,prozac,wellbutrin and omeprazole. Dr Tanya Nones said this should be fine  Please call her back at [phone]

## 2014-01-30 MED ORDER — TRAZODONE HCL 50 MG PO TABS: ORAL_TABLET | ORAL | Status: DC

## 2014-01-30 MED ORDER — OMEPRAZOLE 40 MG PO CPDR: DELAYED_RELEASE_CAPSULE | ORAL | Status: DC

## 2014-01-30 MED ORDER — BUPROPION HCL ER (XL) 300 MG PO TB24: 300.0000 mg | ORAL_TABLET | Freq: Every day | ORAL | Status: DC

## 2014-01-30 MED ORDER — FLUOXETINE HCL 40 MG PO CAPS: 40.0000 mg | ORAL_CAPSULE | Freq: Every day | ORAL | Status: AC

## 2014-01-30 NOTE — Telephone Encounter (Signed)
Med sent to pharm 

## 2014-05-05 ENCOUNTER — Other Ambulatory Visit: Payer: Self-pay | Admitting: Family Medicine

## 2014-09-21 ENCOUNTER — Ambulatory Visit
Admit: 2014-09-21 | Disposition: A | Discharge status: Home / Self Care | Payer: Self-pay | Attending: Registered Nurse | Admitting: Registered Nurse

## 2014-09-21 ENCOUNTER — Ambulatory Visit
Admit: 2014-09-21 | Disposition: A | Discharge status: Home / Self Care | Payer: Self-pay | Attending: Family Medicine | Admitting: Family Medicine

## 2014-10-25 ENCOUNTER — Emergency Department: Payer: 59

## 2014-10-25 ENCOUNTER — Emergency Department
Admission: EM | Admit: 2014-10-25 | Discharge: 2014-10-25 | Disposition: A | Discharge status: Home / Self Care | Payer: 59 | Attending: Emergency Medicine | Admitting: Emergency Medicine

## 2014-10-25 ENCOUNTER — Encounter: Payer: Self-pay | Admitting: Emergency Medicine

## 2014-10-25 DIAGNOSIS — R1011 Right upper quadrant pain: Secondary | ICD-10-CM | POA: Diagnosis not present

## 2014-10-25 DIAGNOSIS — Z3202 Encounter for pregnancy test, result negative: Secondary | ICD-10-CM | POA: Insufficient documentation

## 2014-10-25 HISTORY — DX: Chronic fatigue, unspecified: R53.82

## 2014-10-25 HISTORY — DX: Myalgic encephalomyelitis/chronic fatigue syndrome: G93.32

## 2014-10-25 HISTORY — DX: Fibromyalgia: M79.7

## 2014-10-25 LAB — COMPREHENSIVE METABOLIC PANEL
ALT: 13 U/L — ABNORMAL LOW (ref 14–54)
ANION GAP: 5 (ref 5–15)
AST: 20 U/L (ref 15–41)
Albumin: 3.5 g/dL (ref 3.5–5.0)
Alkaline Phosphatase: 56 U/L (ref 38–126)
BUN: 14 mg/dL (ref 6–20)
CO2: 26 mmol/L (ref 22–32)
Calcium: 8.4 mg/dL — ABNORMAL LOW (ref 8.9–10.3)
Chloride: 106 mmol/L (ref 101–111)
Creatinine, Ser: 0.85 mg/dL (ref 0.44–1.00)
GFR calc non Af Amer: >60 mL/min (ref >60)
GLUCOSE: 89 mg/dL (ref 65–99)
Potassium: 3.7 mmol/L (ref 3.5–5.1)
Sodium: 137 mmol/L (ref 135–145)
Total Bilirubin: 0.3 mg/dL (ref 0.3–1.2)
Total Protein: 6.9 g/dL (ref 6.5–8.1)

## 2014-10-25 LAB — URINALYSIS COMPLETE WITH MICROSCOPIC (ARMC ONLY)
Bacteria, UA: NONE SEEN
Bilirubin Urine: NEGATIVE
Glucose, UA: NEGATIVE mg/dL
Hgb urine dipstick: NEGATIVE
Ketones, ur: NEGATIVE mg/dL
Nitrite: NEGATIVE
Protein, ur: NEGATIVE mg/dL
Specific Gravity, Urine: 1.025 (ref 1.005–1.030)
pH: 5 (ref 5.0–8.0)

## 2014-10-25 LAB — CBC WITH DIFFERENTIAL/PLATELET
Basophils Absolute: 0 K/uL (ref 0–0.1)
Basophils Relative: 1 %
Eosinophils Absolute: 0.2 K/uL (ref 0–0.7)
Eosinophils Relative: 3 %
HCT: 34 % — ABNORMAL LOW (ref 35.0–47.0)
Hemoglobin: 11.5 g/dL — ABNORMAL LOW (ref 12.0–16.0)
Lymphocytes Relative: 37 %
Lymphs Abs: 2.1 K/uL (ref 1.0–3.6)
MCH: 27 pg (ref 26.0–34.0)
MCHC: 33.9 g/dL (ref 32.0–36.0)
MCV: 79.6 fL — ABNORMAL LOW (ref 80.0–100.0)
Monocytes Absolute: 0.3 K/uL (ref 0.2–0.9)
Monocytes Relative: 6 %
Neutro Abs: 3.2 K/uL (ref 1.4–6.5)
Neutrophils Relative %: 53 %
Platelets: 305 K/uL (ref 150–440)
RBC: 4.27 MIL/uL (ref 3.80–5.20)
RDW: 17.5 % — ABNORMAL HIGH (ref 11.5–14.5)
WBC: 5.9 K/uL (ref 3.6–11.0)

## 2014-10-25 LAB — PREGNANCY, URINE: Preg Test, Ur: NEGATIVE

## 2014-10-25 MED ORDER — ONDANSETRON 4 MG PO TBDP
ORAL_TABLET | ORAL | Status: AC
  Filled 2014-10-25: qty 1

## 2014-10-25 MED ORDER — MORPHINE SULFATE 4 MG/ML IJ SOLN
INTRAMUSCULAR | Status: AC
  Administered 2014-10-25: 4 mg via INTRAVENOUS
  Filled 2014-10-25: qty 1

## 2014-10-25 MED ORDER — IOHEXOL 240 MG/ML SOLN
25.0000 mL | Freq: Once | INTRAMUSCULAR | Status: AC | PRN
  Administered 2014-10-25: 25 mL via ORAL

## 2014-10-25 MED ORDER — PROMETHAZINE HCL 25 MG/ML IJ SOLN
25.0000 mg | Freq: Once | INTRAMUSCULAR | Status: AC
Start: 2014-10-25 — End: 2014-10-25
  Administered 2014-10-25: 25 mg via INTRAVENOUS

## 2014-10-25 MED ORDER — MORPHINE SULFATE 4 MG/ML IJ SOLN
INTRAMUSCULAR | Status: AC
  Administered 2014-10-25: 4 mg
  Filled 2014-10-25: qty 1

## 2014-10-25 MED ORDER — SODIUM CHLORIDE 0.9 % IV BOLUS (SEPSIS)
1000.0000 mL | Freq: Once | INTRAVENOUS | Status: AC
  Administered 2014-10-25: 1000 mL via INTRAVENOUS

## 2014-10-25 MED ORDER — OXYCODONE-ACETAMINOPHEN 5-325 MG PO TABS: 1.0000 | ORAL_TABLET | Freq: Four times a day (QID) | ORAL | Status: DC | PRN

## 2014-10-25 MED ORDER — MORPHINE SULFATE 4 MG/ML IJ SOLN
4.0000 mg | Freq: Once | INTRAMUSCULAR | Status: AC
  Administered 2014-10-25: 4 mg via INTRAVENOUS

## 2014-10-25 MED ORDER — IOHEXOL 300 MG/ML  SOLN
100.0000 mL | Freq: Once | INTRAMUSCULAR | Status: AC | PRN
  Administered 2014-10-25: 100 mL via INTRAVENOUS

## 2014-10-25 MED ORDER — PROMETHAZINE HCL 25 MG/ML IJ SOLN
INTRAMUSCULAR | Status: AC
  Administered 2014-10-25: 25 mg via INTRAVENOUS
  Filled 2014-10-25: qty 1

## 2014-10-25 MED ORDER — ONDANSETRON HCL 4 MG/2ML IJ SOLN
INTRAMUSCULAR | Status: AC
  Administered 2014-10-25: 4 mg
  Filled 2014-10-25: qty 2

## 2014-10-25 MED ORDER — ONDANSETRON 4 MG PO TBDP
4.0000 mg | ORAL_TABLET | Freq: Once | ORAL | Status: AC
  Administered 2014-10-25: 4 mg via ORAL

## 2014-10-25 NOTE — Discharge Instructions (Signed)
Abdominal Pain, Women °Abdominal (stomach, pelvic, or belly) pain can be caused by many things. It is important to tell your doctor: °· The location of the pain. °· Does it come and go or is it present all the time? °· Are there things that start the pain (eating certain foods, exercise)? °· Are there other symptoms associated with the pain (fever, nausea, vomiting, diarrhea)? °All of this is helpful to know when trying to find the cause of the pain. °CAUSES  °· Stomach: virus or bacteria infection, or ulcer. °· Intestine: appendicitis (inflamed appendix), regional ileitis (Crohn's disease), ulcerative colitis (inflamed colon), irritable bowel syndrome, diverticulitis (inflamed diverticulum of the colon), or cancer of the stomach or intestine. °· Gallbladder disease or stones in the gallbladder. °· Kidney disease, kidney stones, or infection. °· Pancreas infection or cancer. °· Fibromyalgia (pain disorder). °· Diseases of the female organs: °¨ Uterus: fibroid (non-cancerous) tumors or infection. °¨ Fallopian tubes: infection or tubal pregnancy. °¨ Ovary: cysts or tumors. °¨ Pelvic adhesions (scar tissue). °¨ Endometriosis (uterus lining tissue growing in the pelvis and on the pelvic organs). °¨ Pelvic congestion syndrome (female organs filling up with blood just before the menstrual period). °¨ Pain with the menstrual period. °¨ Pain with ovulation (producing an egg). °¨ Pain with an IUD (intrauterine device, birth control) in the uterus. °¨ Cancer of the female organs. °· Functional pain (pain not caused by a disease, may improve without treatment). °· Psychological pain. °· Depression. °DIAGNOSIS  °Your doctor will decide the seriousness of your pain by doing an examination. °· Blood tests. °· X-rays. °· Ultrasound. °· CT scan (computed tomography, special type of X-ray). °· MRI (magnetic resonance imaging). °· Cultures, for infection. °· Barium enema (dye inserted in the large intestine, to better view it with  X-rays). °· Colonoscopy (looking in intestine with a lighted tube). °· Laparoscopy (minor surgery, looking in abdomen with a lighted tube). °· Major abdominal exploratory surgery (looking in abdomen with a large incision). °TREATMENT  °The treatment will depend on the cause of the pain.  °· Many cases can be observed and treated at home. °· Over-the-counter medicines recommended by your caregiver. °· Prescription medicine. °· Antibiotics, for infection. °· Birth control pills, for painful periods or for ovulation pain. °· Hormone treatment, for endometriosis. °· Nerve blocking injections. °· Physical therapy. °· Antidepressants. °· Counseling with a psychologist or psychiatrist. °· Minor or major surgery. °HOME CARE INSTRUCTIONS  °· Do not take laxatives, unless directed by your caregiver. °· Take over-the-counter pain medicine only if ordered by your caregiver. Do not take aspirin because it can cause an upset stomach or bleeding. °· Try a clear liquid diet (broth or water) as ordered by your caregiver. Slowly move to a bland diet, as tolerated, if the pain is related to the stomach or intestine. °· Have a thermometer and take your temperature several times a day, and record it. °· Bed rest and sleep, if it helps the pain. °· Avoid sexual intercourse, if it causes pain. °· Avoid stressful situations. °· Keep your follow-up appointments and tests, as your caregiver orders. °· If the pain does not go away with medicine or surgery, you may try: °¨ Acupuncture. °¨ Relaxation exercises (yoga, meditation). °¨ Group therapy. °¨ Counseling. °SEEK MEDICAL CARE IF:  °· You notice certain foods cause stomach pain. °· Your home care treatment is not helping your pain. °· You need stronger pain medicine. °· You want your IUD removed. °· You feel faint or   lightheaded. °· You develop nausea and vomiting. °· You develop a rash. °· You are having side effects or an allergy to your medicine. °SEEK IMMEDIATE MEDICAL CARE IF:  °· Your  pain does not go away or gets worse. °· You have a fever. °· Your pain is felt only in portions of the abdomen. The right side could possibly be appendicitis. The left lower portion of the abdomen could be colitis or diverticulitis. °· You are passing blood in your stools (bright red or black tarry stools, with or without vomiting). °· You have blood in your urine. °· You develop chills, with or without a fever. °· You pass out. °MAKE SURE YOU:  °· Understand these instructions. °· Will watch your condition. °· Will get help right away if you are not doing well or get worse. °Document Released: 03/21/2007 Document Revised: 10/08/2013 Document Reviewed: 04/10/2009 °[employer]® Patient Information ©2015 [employer]. This information is not intended to replace advice given to you by your health care provider. Make sure you discuss any questions you have with your health care provider. ° °

## 2014-10-25 NOTE — ED Provider Notes (Signed)
[facility] Emergency Department Provider Note  Time seen: 1:44 PM  I have reviewed the triage vital signs and the nursing notes.   HISTORY  Chief Complaint Abdominal Pain    HPI April [name] is a 29 y.o. female with past medical history of fibromyalgia who presents to the emergency department with right upper quadrant pain. According to the patient for the past one month she has had intermittent right upper quadrant pain when she eats, however for the past one week it has been constant. Patient denies any fever, does state nausea but denies vomiting or diarrhea. Denies dysuria. Patient describes her pain as dull located in the right upper quadrant wrapping around to the right side of her back, constant, but exacerbated with eating or drinking.Patient states she had an ultrasound done several weeks ago showing gallstones but no infection, and as the pain was intermittent at that time she opted not to have her gallbladder removed. However since the pain has become much worse and much more constant.     Past Medical History  Diagnosis Date  . Fibromyalgia   . Chronic fatigue syndrome     There are no active problems to display for this patient.   Past Surgical History  Procedure Laterality Date  . Laparascopic      Endometrial scar tissue removed  . Wisdom tooth extraction      No current outpatient prescriptions on file.  Allergies Review of patient's allergies indicates no known allergies.  No family history on file.  Social History History  Substance Use Topics  . Smoking status: Never Smoker   . Smokeless tobacco: Never Used  . Alcohol Use: No    Review of Systems Constitutional: Negative for fever. Cardiovascular: Negative for chest pain. Respiratory: Negative for shortness of breath. Gastrointestinal: Positive for right upper quadrant abdominal pain, positive for nausea, negative for vomiting/diarrhea. Genitourinary: Negative for  dysuria. Musculoskeletal: Positive for right-sided back pain. 10-point ROS otherwise negative.  ____________________________________________   PHYSICAL EXAM:  VITAL SIGNS: ED Triage Vitals  Enc Vitals Group     BP 10/25/14 1114 100/62 mmHg     Pulse Rate 10/25/14 1114 95     Resp 10/25/14 1114 16     Temp 10/25/14 1114 98.2 F (36.8 C)     Temp Source 10/25/14 1114 Oral     SpO2 10/25/14 1114 99 %     Weight 10/25/14 1114 177 lb (80.287 kg)     Height 10/25/14 1114 5\' 5"  (1.651 m)     Head Cir --      Peak Flow --      Pain Score 10/25/14 1115 6     Pain Loc --      Pain Edu? --      Excl. in GC? --     Constitutional: Alert and oriented. Well appearing and in no distress. ENT   Mouth/Throat: Mucous membranes are moist. Cardiovascular: Normal rate, regular rhythm. No murmurs, rubs, or gallops. Respiratory: Normal respiratory effort without tachypnea nor retractions. Breath sounds are clear Gastrointestinal: Soft, moderate right upper quadrant tenderness palpation, no rebound, no guarding. No CVA tenderness palpation. Musculoskeletal: Nontender with normal range of motion in all extremities.  Neurologic:  Normal speech and language. No gross focal neurologic deficits  Skin:  Skin is warm, dry and intact.  Psychiatric: Mood and affect are normal. Speech and behavior are normal.   ____________________________________________   RADIOLOGY  Ultrasound within normal limits. CT within normal limits ____________________________________________  INITIAL IMPRESSION / ASSESSMENT AND PLAN / ED COURSE  Pertinent labs & imaging results that were available during my care of the patient were reviewed by me and considered in my medical decision making (see chart for details).  29 year old female with persistent right upper quadrant pain, getting worse and more constant. Suspicious for cholecystitis/biliary colic. Currently awaiting lab results. We'll obtain a right upper  quadrant ultrasound and consult general surgery.   ----------------------------------------- 5:03 PM on 10/25/2014 -----------------------------------------  Ultrasound within normal limits, patient continued to moderate upper quadrant pain. Discussed with the patient follow up with GI and surgery as an outpatient versus CT scan in the ER. Patient wishes to proceed with CT scan to further evaluate.  ----------------------------------------- 7:17 PM on 10/25/2014 -----------------------------------------  CT within normal limits, discussed with patient follow up with GI medicine and general surgery for further workup and possible HIDA scan. Patient agreeable to plan. We'll discharge her short course of Percocet ____________________________________________   FINAL CLINICAL IMPRESSION(S) / ED DIAGNOSES  Biliary colic   Minna AntisKevin Kylyn Mcdade, MD 10/25/14 [phone]

## 2014-10-25 NOTE — ED Notes (Signed)
Patient states she had scan two weeks ago that showed gall stones. Chose at that time to not have gallbladder removed. States she is now having sharp pain to RUQ that feels same as when diagnosed. Patient states she has been nausea, worse after eating.

## 2014-10-25 NOTE — ED Notes (Signed)
CT notified that pt has finished her Po contrast

## 2014-10-29 ENCOUNTER — Other Ambulatory Visit: Payer: Self-pay | Admitting: Surgery

## 2014-10-29 DIAGNOSIS — R1011 Right upper quadrant pain: Secondary | ICD-10-CM

## 2014-10-29 DIAGNOSIS — R110 Nausea: Secondary | ICD-10-CM

## 2014-10-31 ENCOUNTER — Ambulatory Visit
Admission: RE | Admit: 2014-10-31 | Discharge: 2014-10-31 | Disposition: A | Discharge status: Home / Self Care | Payer: 59 | Source: Ambulatory Visit | Attending: Surgery | Admitting: Surgery

## 2014-11-06 ENCOUNTER — Encounter: Payer: Self-pay | Admitting: Internal Medicine

## 2015-01-12 ENCOUNTER — Ambulatory Visit
Admission: EM | Admit: 2015-01-12 | Discharge: 2015-01-12 | Disposition: A | Discharge status: Home / Self Care | Payer: 59 | Attending: Internal Medicine | Admitting: Internal Medicine

## 2015-01-12 ENCOUNTER — Encounter: Payer: Self-pay | Admitting: Emergency Medicine

## 2015-01-12 DIAGNOSIS — N390 Urinary tract infection, site not specified: Secondary | ICD-10-CM | POA: Insufficient documentation

## 2015-01-12 DIAGNOSIS — Z79899 Other long term (current) drug therapy: Secondary | ICD-10-CM | POA: Diagnosis not present

## 2015-01-12 DIAGNOSIS — F329 Major depressive disorder, single episode, unspecified: Secondary | ICD-10-CM | POA: Insufficient documentation

## 2015-01-12 DIAGNOSIS — E039 Hypothyroidism, unspecified: Secondary | ICD-10-CM | POA: Insufficient documentation

## 2015-01-12 DIAGNOSIS — M797 Fibromyalgia: Secondary | ICD-10-CM | POA: Diagnosis not present

## 2015-01-12 DIAGNOSIS — R109 Unspecified abdominal pain: Secondary | ICD-10-CM | POA: Diagnosis present

## 2015-01-12 DIAGNOSIS — D649 Anemia, unspecified: Secondary | ICD-10-CM | POA: Diagnosis not present

## 2015-01-12 DIAGNOSIS — R5382 Chronic fatigue, unspecified: Secondary | ICD-10-CM | POA: Diagnosis not present

## 2015-01-12 DIAGNOSIS — R8281 Pyuria: Secondary | ICD-10-CM

## 2015-01-12 HISTORY — DX: Renal tubulo-interstitial disease, unspecified: N15.9

## 2015-01-12 HISTORY — DX: Unspecified asthma with (acute) exacerbation: J45.901

## 2015-01-12 LAB — URINALYSIS COMPLETE WITH MICROSCOPIC (ARMC ONLY)

## 2015-01-12 MED ORDER — CIPROFLOXACIN HCL 500 MG PO TABS: 500.0000 mg | ORAL_TABLET | Freq: Two times a day (BID) | ORAL | Status: DC

## 2015-01-12 MED ORDER — KETOROLAC TROMETHAMINE 60 MG/2ML IM SOLN
60.0000 mg | Freq: Once | INTRAMUSCULAR | Status: AC
  Administered 2015-01-12: 60 mg via INTRAMUSCULAR

## 2015-01-12 NOTE — ED Notes (Addendum)
Kidney pain, low back pain, fever, fatigue  for 2 week.

## 2015-01-12 NOTE — ED Provider Notes (Addendum)
CSN: [id number]     Arrival date & time 01/12/15  1129 History   First MD Initiated Contact with Patient 01/12/15 1248     Chief Complaint  Patient presents with  . Flank Pain   HPI  Patient is a 29 year old with reported history of anemia and intestinal malabsorption who presents today with dysuria and back pain. Dysuria started 2 weeks ago, but seemed to go away. Had the onset of fatigue and malaise a week ago, with reported fever of 99.5. Last night had the onset of pain in the mid back, which she attributes to urinary tract infection. She has nausea, decreased appetite, but no vomiting. She has had 3 episodes of diarrhea in the last 2 weeks; she describes diarrhea as being a very large volume bowel evacuation. 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 Her mother is with her and expresses concern about her low diastolic blood pressure, as well as the reported history of anemia and the reported history of malabsorption. Patient has an appointment to establish care in 2 days with a new PCP, Dr. Berniece Andreas, at [facility] primary care on [address]  Past Medical History  Diagnosis Date  . Subclinical hypothyroidism   . Fibromyalgia syndrome   . Depression   . Anemia   . Fibromyalgia   . Chronic fatigue syndrome   . ASTHMA UNSPECIFIED WITH EXACERBATION 01/29/2008  . Kidney infection    Past Surgical History  Procedure Laterality Date  . Laparascopic      Endometrial scar tissue removed  . Wisdom tooth extraction     Family History  Problem Relation Age of Onset  . Depression Mother    History  Substance Use Topics  . Smoking status: Never Smoker   . Smokeless tobacco: Never Used  . Alcohol Use: No   OB History    Gravida Para Term Preterm AB TAB SAB Ectopic Multiple Living   0 0 0 0 0 0 0 0       Review of Systems  All other systems reviewed and are  negative.   Allergies  Aripiprazole; Codeine; Lamotrigine; and Zolpidem tartrate  Home Medications   Prior to Admission medications   Medication Sig Start Date End Date Taking? Authorizing Provider  buPROPion (WELLBUTRIN XL) 300 MG 24 hr tablet TAKE 1 TABLET BY MOUTH EVERY DAY 05/06/14  Yes Donita Brooks, MD  FLUoxetine (PROZAC) 40 MG capsule Take 1 capsule (40 mg total) by mouth daily. 01/30/14  Yes Donita Brooks, MD  omeprazole (PRILOSEC) 40 MG capsule TAKE ONE CAPSULE BY MOUTH DAILY 01/30/14  Yes Donita Brooks, MD  traZODone (DESYREL) 50 MG tablet TAKE 1 TABLET BY MOUTH EVERY NIGHT AT BEDTIME AS NEEDED FOR SLEEP 01/30/14  Yes Donita Brooks, MD  etonogestrel-ethinyl estradiol (NUVARING) 0.12-0.015 MG/24HR vaginal ring Place 1 each vaginally every 28 (twenty-eight) days. Insert vaginally and leave in place for 3 consecutive weeks, then remove for 1 week.    Historical Provider, MD  oxyCODONE-acetaminophen (ROXICET) 5-325 MG per tablet Take 1 tablet by mouth every 6 (six) hours as needed. 10/25/14   Minna Antis, MD   BP 96/53 mmHg  Pulse 86  Temp(Src) 98.7 F (37.1 C) (Tympanic)  Resp 18  Ht  (1.651 m)  Wt 177 lb (80.287 kg)  BMI 29.45 kg/m2  SpO2 99%  LMP 12/16/2014 Physical Exam  Constitutional: She is oriented to person,  place, and time. No distress.  Alert, nicely groomed  HENT:  Head: Atraumatic.  Eyes:  Conjugate gaze, no eye redness/drainage  Neck: Neck supple.  Cardiovascular: Normal rate and regular rhythm.   Pulmonary/Chest: No respiratory distress. She has no wheezes. She has no rales.  Lungs clear, symmetric breath sounds  Abdominal: She exhibits no distension.  Musculoskeletal: Normal range of motion.  No leg swelling Equivocal CVAT; posterior mid back hyperesthetic to light touch, no rash, no focal swelling, no bruising  Neurological: She is alert and oriented to person, place, and time.  Skin: Skin is warm and dry.  No cyanosis  Nursing  note and vitals reviewed.  Orthostatic VS for the past 24 hrs:  BP- Lying Pulse- Lying BP- Sitting Pulse- Sitting BP- Standing at 0 minutes Pulse- Standing at 0 minutes  01/12/15 1317 103/57 mmHg 85 101/63 mmHg 89 104/64 mmHg 88      ED Course  Procedures  Results for orders placed or performed during the hospital encounter of 01/12/15  Urinalysis complete, with microscopic  Result Value Ref Range   Color, Urine ORANGE (A) YELLOW   APPearance CLEAR CLEAR   Glucose, UA (A) NEGATIVE mg/dL    TEST NOT REPORTED DUE TO COLOR INTERFERENCE OF URINE PIGMENT   Bilirubin Urine (A) NEGATIVE    TEST NOT REPORTED DUE TO COLOR INTERFERENCE OF URINE PIGMENT   Ketones, ur (A) NEGATIVE mg/dL    TEST NOT REPORTED DUE TO COLOR INTERFERENCE OF URINE PIGMENT   Specific Gravity, Urine  1.005 - 1.030    TEST NOT REPORTED DUE TO COLOR INTERFERENCE OF URINE PIGMENT   Hgb urine dipstick (A) NEGATIVE    TEST NOT REPORTED DUE TO COLOR INTERFERENCE OF URINE PIGMENT   pH  5.0 - 8.0    TEST NOT REPORTED DUE TO COLOR INTERFERENCE OF URINE PIGMENT   Protein, ur (A) NEGATIVE mg/dL    TEST NOT REPORTED DUE TO COLOR INTERFERENCE OF URINE PIGMENT   Nitrite (A) NEGATIVE    TEST NOT REPORTED DUE TO COLOR INTERFERENCE OF URINE PIGMENT   Leukocytes, UA (A) NEGATIVE    TEST NOT REPORTED DUE TO COLOR INTERFERENCE OF URINE PIGMENT   RBC / HPF 0-5 <3 RBC/hpf   WBC, UA 6-30 <3 WBC/hpf   Bacteria, UA FEW (A) RARE   Squamous Epithelial / LPF 0-5 (A) RARE   Urine culture pending  Medications  ketorolac (TORADOL) injection 60 mg (60 mg Intramuscular Given 01/12/15 1315)   Back pain improved after injection.  MDM   1. Pyuria    Discharge Medication List as of 01/12/2015  2:22 PM    START taking these medications   Details  ciprofloxacin (CIPRO) 500 MG tablet Take 1 tablet (500 mg total) by mouth every 12 (twelve) hours., Starting 01/12/2015, Until Discontinued, Normal       followup Dr Berniece Andreas as  planned.    Eustace Moore, MD 01/12/15 1659  Eustace Moore, MD 01/12/15 [phone]

## 2015-01-15 LAB — URINE CULTURE

## 2015-03-24 ENCOUNTER — Other Ambulatory Visit: Payer: Self-pay

## 2015-05-22 ENCOUNTER — Other Ambulatory Visit: Payer: Self-pay | Admitting: Family Medicine

## 2015-06-04 ENCOUNTER — Other Ambulatory Visit: Payer: Self-pay | Admitting: Family Medicine

## 2015-08-04 ENCOUNTER — Other Ambulatory Visit: Payer: Self-pay

## 2015-10-18 ENCOUNTER — Ambulatory Visit: Payer: Commercial Managed Care - HMO

## 2015-10-18 ENCOUNTER — Telehealth: Payer: Self-pay | Admitting: Family Medicine

## 2015-10-18 ENCOUNTER — Ambulatory Visit
Admission: EM | Admit: 2015-10-18 | Discharge: 2015-10-18 | Disposition: A | Discharge status: Home / Self Care | Payer: Commercial Managed Care - HMO | Attending: Family Medicine | Admitting: Family Medicine

## 2015-10-18 ENCOUNTER — Encounter: Payer: Self-pay | Admitting: *Deleted

## 2015-10-18 DIAGNOSIS — S39012A Strain of muscle, fascia and tendon of lower back, initial encounter: Secondary | ICD-10-CM | POA: Diagnosis not present

## 2015-10-18 DIAGNOSIS — M549 Dorsalgia, unspecified: Secondary | ICD-10-CM | POA: Insufficient documentation

## 2015-10-18 DIAGNOSIS — W108XXA Fall (on) (from) other stairs and steps, initial encounter: Secondary | ICD-10-CM | POA: Insufficient documentation

## 2015-10-18 DIAGNOSIS — E039 Hypothyroidism, unspecified: Secondary | ICD-10-CM | POA: Diagnosis not present

## 2015-10-18 DIAGNOSIS — M797 Fibromyalgia: Secondary | ICD-10-CM | POA: Insufficient documentation

## 2015-10-18 DIAGNOSIS — Z79899 Other long term (current) drug therapy: Secondary | ICD-10-CM | POA: Insufficient documentation

## 2015-10-18 DIAGNOSIS — S300XXA Contusion of lower back and pelvis, initial encounter: Secondary | ICD-10-CM | POA: Insufficient documentation

## 2015-10-18 DIAGNOSIS — Y939 Activity, unspecified: Secondary | ICD-10-CM | POA: Insufficient documentation

## 2015-10-18 DIAGNOSIS — Z888 Allergy status to other drugs, medicaments and biological substances status: Secondary | ICD-10-CM | POA: Insufficient documentation

## 2015-10-18 DIAGNOSIS — S3992XA Unspecified injury of lower back, initial encounter: Secondary | ICD-10-CM | POA: Insufficient documentation

## 2015-10-18 DIAGNOSIS — J45901 Unspecified asthma with (acute) exacerbation: Secondary | ICD-10-CM | POA: Insufficient documentation

## 2015-10-18 DIAGNOSIS — F329 Major depressive disorder, single episode, unspecified: Secondary | ICD-10-CM | POA: Insufficient documentation

## 2015-10-18 HISTORY — DX: Familial dysautonomia (riley-day): G90.1

## 2015-10-18 MED ORDER — CARISOPRODOL 350 MG PO TABS: 350.0000 mg | ORAL_TABLET | Freq: Three times a day (TID) | ORAL | Status: AC | PRN

## 2015-10-18 MED ORDER — METAXALONE 800 MG PO TABS: 800.0000 mg | ORAL_TABLET | Freq: Three times a day (TID) | ORAL | Status: DC

## 2015-10-18 MED ORDER — KETOROLAC TROMETHAMINE 60 MG/2ML IM SOLN
60.0000 mg | Freq: Once | INTRAMUSCULAR | Status: AC
  Administered 2015-10-18: 60 mg via INTRAMUSCULAR

## 2015-10-18 NOTE — ED Notes (Signed)
Pt states that she slipped and fell down her 10 wooden steps in her home today at about 1:30 this afternoon.  Pt states she has pain in her tailbone, standing her pain is about a 4 and sitting her pain is 8 or 9.

## 2015-10-18 NOTE — ED Provider Notes (Signed)
CSN: [id number]     Arrival date & time 10/18/15  1354 History   First MD Initiated Contact with Patient 10/18/15 1433     Chief Complaint  Patient presents with  . Fall   (Consider location/radiation/quality/duration/timing/severity/associated sxs/prior Treatment) HPI Comments: Single caucasian female here for evaluation buttock/back pain s/p falling down 10 wooden stairs at home this afternoon worst with sitting.  Denied saddle paresthesias, incontinence, weakness arms/legs.  Took a tramadol at home and not helping.  Patient is a 30 y.o. female presenting with fall. The history is provided by the patient and a parent.  Fall This is a new problem. The current episode started less than 1 hour ago. The problem occurs constantly. The problem has not changed since onset.Pertinent negatives include no chest pain, no abdominal pain, no headaches and no shortness of breath. The symptoms are aggravated by walking and exertion. Nothing relieves the symptoms. Treatments tried: tramadol. The treatment provided no relief.    Past Medical History  Diagnosis Date  . Subclinical hypothyroidism   . Fibromyalgia syndrome   . Depression   . Anemia   . Fibromyalgia   . Chronic fatigue syndrome   . ASTHMA UNSPECIFIED WITH EXACERBATION 01/29/2008  . Kidney infection   . Dysautonomia    Past Surgical History  Procedure Laterality Date  . Laparascopic      Endometrial scar tissue removed  . Wisdom tooth extraction     Family History  Problem Relation Age of Onset  . Depression Mother    Social History  Substance Use Topics  . Smoking status: Never Smoker   . Smokeless tobacco: Never Used  . Alcohol Use: No   OB History    Gravida Para Term Preterm AB TAB SAB Ectopic Multiple Living   0 0 0 0 0 0 0 0       Review of Systems  Constitutional: Negative for fever and chills.  HENT: Negative for congestion, ear pain and sore throat.   Eyes: Negative for pain and discharge.  Respiratory:  Negative for cough, shortness of breath and wheezing.   Cardiovascular: Negative for chest pain and leg swelling.  Gastrointestinal: Negative for nausea, vomiting, abdominal pain, diarrhea, constipation and blood in stool.  Genitourinary: Negative for dysuria, hematuria and difficulty urinating.  Musculoskeletal: Positive for back pain. Negative for myalgias, joint swelling, arthralgias, gait problem, neck pain and neck stiffness.  Skin: Negative for rash.  Allergic/Immunologic: Negative for environmental allergies and food allergies.  Neurological: Negative for headaches.  Hematological: Negative for adenopathy. Does not bruise/bleed easily.  Psychiatric/Behavioral: Negative for confusion, sleep disturbance and agitation. The patient is not nervous/anxious.     Allergies  Aripiprazole; Codeine; Lamotrigine; and Zolpidem tartrate  Home Medications   Prior to Admission medications   Medication Sig Start Date End Date Taking? Authorizing Provider  tapentadol (NUCYNTA) 50 MG TABS tablet Take 50 mg by mouth as needed.   Yes Historical Provider, MD  traMADol (ULTRAM) 50 MG tablet Take 100 mg by mouth every 6 (six) hours as needed.   Yes Historical Provider, MD  buPROPion (WELLBUTRIN XL) 300 MG 24 hr tablet TAKE 1 TABLET BY MOUTH EVERY DAY 06/04/15   Duanne Limerick, MD  etonogestrel-ethinyl estradiol (NUVARING) 0.12-0.015 MG/24HR vaginal ring Place 1 each vaginally every 28 (twenty-eight) days. Insert vaginally and leave in place for 3 consecutive weeks, then remove for 1 week.    Historical Provider, MD  FLUoxetine (PROZAC) 40 MG capsule Take 1 capsule (40 mg total) by  mouth daily. 01/30/14   Donita Brooks, MD  metaxalone (SKELAXIN) 800 MG tablet Take 1 tablet (800 mg total) by mouth 3 (three) times daily. 10/18/15   April Barthel, NP  omeprazole (PRILOSEC) 40 MG capsule TAKE ONE CAPSULE BY MOUTH EVERY DAY 06/04/15   Duanne Limerick, MD  traZODone (DESYREL) 50 MG tablet TAKE 1 TABLET BY  MOUTH EVERY NIGHT AT BEDTIME AS NEEDED FOR SLEEP 01/30/14   Donita Brooks, MD   Meds Ordered and Administered this Visit   Medications  ketorolac (TORADOL) injection 60 mg (60 mg Intramuscular Given 10/18/15 1453)    BP 111/64 mmHg  Pulse 105  Temp(Src) 98.3 F (36.8 C) (Oral)  Ht  (1.651 m)  Wt 176 lb (79.833 kg)  BMI 29.29 kg/m2  SpO2 99%  LMP 10/06/2015 No data found.   Physical Exam  Constitutional: She is oriented to person, place, and time. Vital signs are normal. She appears well-developed and well-nourished. She is active and cooperative.  Non-toxic appearance. She does not have a sickly appearance. She does not appear ill. No distress.  HENT:  Head: Normocephalic and atraumatic.  Right Ear: Hearing, external ear and ear canal normal.  Left Ear: Hearing, external ear and ear canal normal.  Nose: No mucosal edema, rhinorrhea, nose lacerations, sinus tenderness, nasal deformity, septal deviation or nasal septal hematoma. No epistaxis.  No foreign bodies. Right sinus exhibits no maxillary sinus tenderness and no frontal sinus tenderness. Left sinus exhibits no maxillary sinus tenderness and no frontal sinus tenderness.  Mouth/Throat: Uvula is midline and mucous membranes are normal. Mucous membranes are not pale, not dry and not cyanotic. She does not have dentures. No oral lesions. No trismus in the jaw. Normal dentition. No dental abscesses, uvula swelling, lacerations or dental caries. No oropharyngeal exudate, posterior oropharyngeal edema, posterior oropharyngeal erythema or tonsillar abscesses.  Eyes: Conjunctivae, EOM and lids are normal. Pupils are equal, round, and reactive to light. Right eye exhibits no chemosis, no discharge, no exudate and no hordeolum. No foreign body present in the right eye. Left eye exhibits no chemosis, no discharge, no exudate and no hordeolum. No foreign body present in the left eye. Right conjunctiva is not injected. Right conjunctiva has  no hemorrhage. Left conjunctiva is not injected. Left conjunctiva has no hemorrhage. No scleral icterus. Right eye exhibits normal extraocular motion and no nystagmus. Left eye exhibits normal extraocular motion and no nystagmus. Right pupil is round and reactive. Left pupil is round and reactive. Pupils are equal.  Neck: Trachea normal and normal range of motion. Neck supple. No tracheal tenderness, no spinous process tenderness and no muscular tenderness present. No rigidity. No tracheal deviation, no edema, no erythema and normal range of motion present. No thyroid mass and no thyromegaly present.  Cardiovascular: Normal rate, regular rhythm, S1 normal, S2 normal, normal heart sounds and intact distal pulses.  PMI is not displaced.  Exam reveals no gallop and no friction rub.   No murmur heard. Pulmonary/Chest: Effort normal and breath sounds normal. No accessory muscle usage or stridor. No respiratory distress. She has no decreased breath sounds. She has no wheezes. She has no rhonchi. She has no rales. She exhibits no tenderness.  Abdominal: Soft. She exhibits no distension.  Musculoskeletal: She exhibits edema and tenderness.       Right shoulder: Normal.       Left shoulder: Normal.       Right hip: Normal.  Left hip: Normal.       Right knee: Normal.       Left knee: Normal.       Cervical back: Normal.       Lumbar back: She exhibits decreased range of motion, tenderness, bony tenderness, edema, pain and spasm.       Back:       Right hand: Normal.       Left hand: Normal.  Worst pain with sitting and forward flexion lumbar; arom limited slightly due to pain  Lymphadenopathy:       Head (right side): No submental, no submandibular, no tonsillar, no preauricular, no posterior auricular and no occipital adenopathy present.       Head (left side): No submental, no submandibular, no tonsillar, no preauricular, no posterior auricular and no occipital adenopathy present.    She has no  cervical adenopathy.       Right cervical: No superficial cervical, no deep cervical and no posterior cervical adenopathy present.      Left cervical: No superficial cervical, no deep cervical and no posterior cervical adenopathy present.  Neurological: She is alert and oriented to person, place, and time. She has normal strength. She is not disoriented. She displays no atrophy and no tremor. No cranial nerve deficit or sensory deficit. She exhibits normal muscle tone. She displays no seizure activity. Coordination and gait normal. GCS eye subscore is 4. GCS verbal subscore is 5. GCS motor subscore is 6.  Skin: Skin is warm, dry and intact. No abrasion, no bruising, no burn, no ecchymosis, no laceration, no lesion, no petechiae and no rash noted. She is not diaphoretic. No cyanosis or erythema. No pallor. Nails show no clubbing.  Psychiatric: She has a normal mood and affect. Her speech is normal and behavior is normal. Judgment and thought content normal. Cognition and memory are normal.  Nursing note and vitals reviewed.   ED Course  Procedures (including critical care time)  Labs Review Labs Reviewed - No data to display  Imaging Review Dg Sacrum/coccyx  10/18/2015  CLINICAL DATA:  Larey Seat down stairs with injury to coccyx. EXAM: SACRUM AND COCCYX - 2+ VIEW COMPARISON:  CT 09/14/2010 FINDINGS: There is no evidence of fracture or other focal bone lesions. IMPRESSION: Negative. Electronically Signed   By: Elberta Fortis M.D.   On: 10/18/2015 16:26     1453 toradol 60mg  IM given my CMA Annitta Jersey  1545 patient reported feeling better decreased pain able to sit.  1635 discussed xray results negative for coccyx fracture or dislocation copy of radiology report given to patient.  Patient and father verbalized understanding information/instructions, agreed with plan of care and had no further questions at this time.  MDM   1. Coccyx contusion, initial encounter   2. Low back strain, initial  encounter    For acute pain, rest, and intermittent application of heat (do not sleep on heating pad).  I discussed longer-term treatment plan of PRN PO NSAIDS motrin 800mg  po TID Rx given and I discussed a home back care exercise program with a strengthening and flexibility exercise.  Patient given [employer] handout on tailbone pain.  Proper avoidance of heavy lifting discussed. Recommended gentle stretching, AROM, avoid prolonged sitting/standing or lying in bed.   Consider use of donut pillow. Consider physical therapy or chiropractic care and further imaging if not improving over next two weeks.  Call or return to clinic as needed if these symptoms worsen or fail to improve as anticipated  especially leg weakness, loss of bowel/bladder control or saddle paresthesias.   Patient and father verbalized understanding of instructions/information and agreed with plan of care.  P2:  Injury Prevention, fitness  For acute pain, rest, and intermittent application of heat (do not sleep on heating pad).  I discussed longer-term treatment plan of tylenol 1000mg  po QID prn and I discussed a home back care exercise program with a strengthening and flexibility exercise.  Patient given [employer] handout on low back strain with rehab exercises.  Has tramadol at home for prn use.  Rx Skelaxin po TID prn muscle spasms starting tomorrow as toradol in clinic today.  Proper avoidance of heavy lifting discussed.  Consider physical therapy or chiropractic care and radiology if not improving.  Call or return to clinic as needed if these symptoms worsen or fail to improve as anticipated especially leg weakness, loss of bowel/bladder control or saddle paresthesias.   Patient verbalized understanding of instructions/information and agreed with plan of care.  P2:  Injury Prevention, fitness   April Barthelina A Sonda Coppens, NP 10/18/15 1640

## 2015-10-18 NOTE — Telephone Encounter (Signed)
Patient requested I call [facility] for medication alternatives as cannot afford Skelaxin.  Discussed with pharmacist robaxin and flexeril possible seizures and serotonin syndrome with her chronic medications. Skelaxin only serotonin syndrome approximately $100 for Rx; Good Rx would drop to $50.  Pharmacist asked if could change to [facility] as sedation only no serotonin syndrome or seizure threat for muscle spasm prn use.  New Rx soma 350mg  po TID prn muscle spasms #21 RF0 entered for patient.  Pharmacist stated they would contact patient and notify her of Rx change.

## 2015-10-18 NOTE — Discharge Instructions (Signed)
Cryotherapy °Cryotherapy means treatment with cold. Ice or gel packs can be used to reduce both pain and swelling. Ice is the most helpful within the first 24 to 48 hours after an injury or flare-up from overusing a muscle or joint. Sprains, strains, spasms, burning pain, shooting pain, and aches can all be eased with ice. Ice can also be used when recovering from surgery. Ice is effective, has very few side effects, and is safe for most people to use. °PRECAUTIONS  °Ice is not a safe treatment option for people with: °· Raynaud phenomenon. This is a condition affecting small blood vessels in the extremities. Exposure to cold may cause your problems to return. °· Cold hypersensitivity. There are many forms of cold hypersensitivity, including: °· Cold urticaria. Red, itchy hives appear on the skin when the tissues begin to warm after being iced. °· Cold erythema. This is a red, itchy rash caused by exposure to cold. °· Cold hemoglobinuria. Red blood cells break down when the tissues begin to warm after being iced. The hemoglobin that carry oxygen are passed into the urine because they cannot combine with blood proteins fast enough. °· Numbness or altered sensitivity in the area being iced. °If you have any of the following conditions, do not use ice until you have discussed cryotherapy with your caregiver: °· Heart conditions, such as arrhythmia, angina, or chronic heart disease. °· High blood pressure. °· Healing wounds or open skin in the area being iced. °· Current infections. °· Rheumatoid arthritis. °· Poor circulation. °· Diabetes. °Ice slows the blood flow in the region it is applied. This is beneficial when trying to stop inflamed tissues from spreading irritating chemicals to surrounding tissues. However, if you expose your skin to cold temperatures for too long or without the proper protection, you can damage your skin or nerves. Watch for signs of skin damage due to cold. °HOME CARE INSTRUCTIONS °Follow  these tips to use ice and cold packs safely. °· Place a dry or damp towel between the ice and skin. A damp towel will cool the skin more quickly, so you may need to shorten the time that the ice is used. °· For a more rapid response, add gentle compression to the ice. °· Ice for no more than 10 to 20 minutes at a time. The bonier the area you are icing, the less time it will take to get the benefits of ice. °· Check your skin after 5 minutes to make sure there are no signs of a poor response to cold or skin damage. °· Rest 20 minutes or more between uses. °· Once your skin is numb, you can end your treatment. You can test numbness by very lightly touching your skin. The touch should be so light that you do not see the skin dimple from the pressure of your fingertip. When using ice, most people will feel these normal sensations in this order: cold, burning, aching, and numbness. °· Do not use ice on someone who cannot communicate their responses to pain, such as small children or people with dementia. °HOW TO MAKE AN ICE PACK °Ice packs are the most common way to use ice therapy. Other methods include ice massage, ice baths, and cryosprays. Muscle creams that cause a cold, tingly feeling do not offer the same benefits that ice offers and should not be used as a substitute unless recommended by your caregiver. °To make an ice pack, do one of the following: °· Place crushed ice or a   bag of frozen vegetables in a sealable plastic bag. Squeeze out the excess air. Place this bag inside another plastic bag. Slide the bag into a pillowcase or place a damp towel between your skin and the bag.  Mix 3 parts water with 1 part rubbing alcohol. Freeze the mixture in a sealable plastic bag. When you remove the mixture from the freezer, it will be slushy. Squeeze out the excess air. Place this bag inside another plastic bag. Slide the bag into a pillowcase or place a damp towel between your skin and the bag. SEEK MEDICAL CARE  IF:  You develop white spots on your skin. This may give the skin a blotchy (mottled) appearance.  Your skin turns blue or pale.  Your skin becomes waxy or hard.  Your swelling gets worse. MAKE SURE YOU:   Understand these instructions.  Will watch your condition.  Will get help right away if you are not doing well or get worse.   This information is not intended to replace advice given to you by your health care provider. Make sure you discuss any questions you have with your health care provider.   Document Released: 01/18/2011 Document Revised: 06/14/2014 Document Reviewed: 01/18/2011 Elsevier Interactive Patient Education 2016 [place] A contusion is a deep bruise. Contusions are the result of a blunt injury to tissues and muscle fibers under the skin. The injury causes bleeding under the skin. The skin overlying the contusion may turn blue, purple, or yellow. Minor injuries will give you a painless contusion, but more severe contusions may stay painful and swollen for a few weeks.  CAUSES  This condition is usually caused by a blow, trauma, or direct force to an area of the body. SYMPTOMS  Symptoms of this condition include:  Swelling of the injured area.  Pain and tenderness in the injured area.  Discoloration. The area may have redness and then turn blue, purple, or yellow. DIAGNOSIS  This condition is diagnosed based on a physical exam and medical history. An X-ray, CT scan, or MRI may be needed to determine if there are any associated injuries, such as broken bones (fractures). TREATMENT  Specific treatment for this condition depends on what area of the body was injured. In general, the best treatment for a contusion is resting, icing, applying pressure to (compression), and elevating the injured area. This is often called the RICE strategy. Over-the-counter anti-inflammatory medicines may also be recommended for pain control.  HOME CARE INSTRUCTIONS     Rest the injured area.  If directed, apply ice to the injured area:  Put ice in a plastic bag.  Place a towel between your skin and the bag.  Leave the ice on for 20 minutes, 2-3 times per day.  If directed, apply light compression to the injured area using an elastic bandage. Make sure the bandage is not wrapped too tightly. Remove and reapply the bandage as directed by your health care provider.  If possible, raise (elevate) the injured area above the level of your heart while you are sitting or lying down.  Take over-the-counter and prescription medicines only as told by your health care provider. SEEK MEDICAL CARE IF:  Your symptoms do not improve after several days of treatment.  Your symptoms get worse.  You have difficulty moving the injured area. SEEK IMMEDIATE MEDICAL CARE IF:   You have severe pain.  You have numbness in a hand or foot.  Your hand or foot turns pale or cold.  This information is not intended to replace advice given to you by your health care provider. Make sure you discuss any questions you have with your health care provider.   Document Released: 03/03/2005 Document Revised: 02/12/2015 Document Reviewed: 10/09/2014 Elsevier Interactive Patient Education 2016 [employer] Tailbone Injury The tailbone (coccyx) is the small bone at the lower end of the spine. A tailbone injury may involve stretched ligaments, bruising, or a broken bone (fracture). Tailbone injuries can be painful, and some may take a long time to heal. CAUSES This condition is often caused by falling and landing on the tailbone. Other causes include:  Repeated strain or friction from actions such as rowing and bicycling.  Childbirth. In some cases, the cause may not be known. RISK FACTORS This condition is more common in women than in men. SYMPTOMS Symptoms of this condition include:  Pain in the lower back, especially when sitting.  Pain or difficulty when standing up  from a sitting position.  Bruising in the tailbone area.  Painful bowel movements.  In women, pain during intercourse. DIAGNOSIS This condition may be diagnosed based on your symptoms and a physical exam. X-rays may be taken if a fracture is suspected. You may also have other tests, such as a CT scan or MRI. TREATMENT This condition may be treated with medicines to help relieve your pain. Most tailbone injuries heal on their own in 4-6 weeks. However, recovery time may be longer if the injury involves a fracture. HOME CARE INSTRUCTIONS  Take medicines only as directed by your health care provider.  If directed, apply ice to the injured area:  Put ice in a plastic bag.  Place a towel between your skin and the bag.  Leave the ice on for 20 minutes, 2-3 times per day for the first 1-2 days.  Sit on a large, rubber or inflated ring or cushion to ease your pain. Lean forward when you are sitting to help decrease discomfort.  Avoid sitting for long periods of time.  Increase your activity as the pain allows. Perform any exercises that are recommended by your health care provider or physical therapist.  If you have pain during bowel movements, use stool softeners as directed by your health care provider.  Eat a diet that includes plenty of fiber to help prevent constipation.  Keep all follow-up visits as directed by your health care provider. This is important. PREVENTION Wear appropriate padding and sports gear when bicycling and rowing. This can help to prevent developing an injury that is caused by repeated strain or friction. SEEK MEDICAL CARE IF:  Your pain becomes worse.  Your bowel movements cause a great deal of discomfort.  You are unable to have a bowel movement.  You have uncontrolled urine loss (urinary incontinence).  You have a fever.   This information is not intended to replace advice given to you by your health care provider. Make sure you discuss any  questions you have with your health care provider.   Document Released: 05/21/2000 Document Revised: 10/08/2014 Document Reviewed: 05/20/2014 Elsevier Interactive Patient Education 2016 [employer] Low Back Strain With Rehab A strain is an injury in which a tendon or muscle is torn. The muscles and tendons of the lower back are vulnerable to strains. However, these muscles and tendons are very strong and require a great force to be injured. Strains are classified into three categories. Grade 1 strains cause pain, but the tendon is not lengthened. Grade 2 strains include a lengthened  ligament, due to the ligament being stretched or partially ruptured. With grade 2 strains there is still function, although the function may be decreased. Grade 3 strains involve a complete tear of the tendon or muscle, and function is usually impaired. SYMPTOMS   Pain in the lower back.  Pain that affects one side more than the other.  Pain that gets worse with movement and may be felt in the hip, buttocks, or back of the thigh.  Muscle spasms of the muscles in the back.  Swelling along the muscles of the back.  Loss of strength of the back muscles.  Crackling sound (crepitation) when the muscles are touched. CAUSES  Lower back strains occur when a force is placed on the muscles or tendons that is greater than they can handle. Common causes of injury include:  Prolonged overuse of the muscle-tendon units in the lower back, usually from incorrect posture.  A single violent injury or force applied to the back. RISK INCREASES WITH:  Sports that involve twisting forces on the spine or a lot of bending at the waist (football, rugby, weightlifting, bowling, golf, tennis, speed skating, racquetball, swimming, running, gymnastics, diving).  Poor strength and flexibility.  Failure to warm up properly before activity.  Family history of lower back pain or disk disorders.  Previous back injury or surgery  (especially fusion).  Poor posture with lifting, especially heavy objects.  Prolonged sitting, especially with poor posture. PREVENTION   Learn and use proper posture when sitting or lifting (maintain proper posture when sitting, lift using the knees and legs, not at the waist).  Warm up and stretch properly before activity.  Allow for adequate recovery between workouts.  Maintain physical fitness:  Strength, flexibility, and endurance.  Cardiovascular fitness. PROGNOSIS  If treated properly, lower back strains usually heal within 6 weeks. RELATED COMPLICATIONS   Recurring symptoms, resulting in a chronic problem.  Chronic inflammation, scarring, and partial muscle-tendon tear.  Delayed healing or resolution of symptoms.  Prolonged disability. TREATMENT  Treatment first involves the use of ice and medicine, to reduce pain and inflammation. The use of strengthening and stretching exercises may help reduce pain with activity. These exercises may be performed at home or with a therapist. Severe injuries may require referral to a therapist for further evaluation and treatment, such as ultrasound. Your caregiver may advise that you wear a back brace or corset, to help reduce pain and discomfort. Often, prolonged bed rest results in greater harm then benefit. Corticosteroid injections may be recommended. However, these should be reserved for the most serious cases. It is important to avoid using your back when lifting objects. At night, sleep on your back on a firm mattress with a pillow placed under your knees. If non-surgical treatment is unsuccessful, surgery may be needed.  MEDICATION   If pain medicine is needed, nonsteroidal anti-inflammatory medicines (aspirin and ibuprofen), or other minor pain relievers (acetaminophen), are often advised.  Do not take pain medicine for 7 days before surgery.  Prescription pain relievers may be given, if your caregiver thinks they are needed.  Use only as directed and only as much as you need.  Ointments applied to the skin may be helpful.  Corticosteroid injections may be given by your caregiver. These injections should be reserved for the most serious cases, because they may only be given a certain number of times. HEAT AND COLD  Cold treatment (icing) should be applied for 10 to 15 minutes every 2 to 3 hours for  inflammation and pain, and immediately after activity that aggravates your symptoms. Use ice packs or an ice massage.  Heat treatment may be used before performing stretching and strengthening activities prescribed by your caregiver, physical therapist, or athletic trainer. Use a heat pack or a warm water soak. SEEK MEDICAL CARE IF:   Symptoms get worse or do not improve in 2 to 4 weeks, despite treatment.  You develop numbness, weakness, or loss of bowel or bladder function.  New, unexplained symptoms develop. (Drugs used in treatment may produce side effects.) EXERCISES  RANGE OF MOTION (ROM) AND STRETCHING EXERCISES - Low Back Strain Most people with lower back pain will find that their symptoms get worse with excessive bending forward (flexion) or arching at the lower back (extension). The exercises which will help resolve your symptoms will focus on the opposite motion.  Your physician, physical therapist or athletic trainer will help you determine which exercises will be most helpful to resolve your lower back pain. Do not complete any exercises without first consulting with your caregiver. Discontinue any exercises which make your symptoms worse until you speak to your caregiver.  If you have pain, numbness or tingling which travels down into your buttocks, leg or foot, the goal of the therapy is for these symptoms to move closer to your back and eventually resolve. Sometimes, these leg symptoms will get better, but your lower back pain may worsen. This is typically an indication of progress in your rehabilitation.  Be very alert to any changes in your symptoms and the activities in which you participated in the 24 hours prior to the change. Sharing this information with your caregiver will allow him/her to most efficiently treat your condition.  These exercises may help you when beginning to rehabilitate your injury. Your symptoms may resolve with or without further involvement from your physician, physical therapist or athletic trainer. While completing these exercises, remember:  Restoring tissue flexibility helps normal motion to return to the joints. This allows healthier, less painful movement and activity.  An effective stretch should be held for at least 30 seconds.  A stretch should never be painful. You should only feel a gentle lengthening or release in the stretched tissue. FLEXION RANGE OF MOTION AND STRETCHING EXERCISES: STRETCH - Flexion, Single Knee to Chest   Lie on a firm bed or floor with both legs extended in front of you.  Keeping one leg in contact with the floor, bring your opposite knee to your chest. Hold your leg in place by either grabbing behind your thigh or at your knee.  Pull until you feel a gentle stretch in your lower back. Hold __________ seconds.  Slowly release your grasp and repeat the exercise with the opposite side. Repeat __________ times. Complete this exercise __________ times per day.  STRETCH - Flexion, Double Knee to Chest   Lie on a firm bed or floor with both legs extended in front of you.  Keeping one leg in contact with the floor, bring your opposite knee to your chest.  Tense your stomach muscles to support your back and then lift your other knee to your chest. Hold your legs in place by either grabbing behind your thighs or at your knees.  Pull both knees toward your chest until you feel a gentle stretch in your lower back. Hold __________ seconds.  Tense your stomach muscles and slowly return one leg at a time to the floor. Repeat __________  times. Complete this exercise __________ times per day.  STRETCH - Low Trunk Rotation  Lie on a firm bed or floor. Keeping your legs in front of you, bend your knees so they are both pointed toward the ceiling and your feet are flat on the floor.  Extend your arms out to the side. This will stabilize your upper body by keeping your shoulders in contact with the floor.  Gently and slowly drop both knees together to one side until you feel a gentle stretch in your lower back. Hold for __________ seconds.  Tense your stomach muscles to support your lower back as you bring your knees back to the starting position. Repeat the exercise to the other side. Repeat __________ times. Complete this exercise __________ times per day  EXTENSION RANGE OF MOTION AND FLEXIBILITY EXERCISES: STRETCH - Extension, Prone on Elbows   Lie on your stomach on the floor, a bed will be too soft. Place your palms about shoulder width apart and at the height of your head.  Place your elbows under your shoulders. If this is too painful, stack pillows under your chest.  Allow your body to relax so that your hips drop lower and make contact more completely with the floor.  Hold this position for __________ seconds.  Slowly return to lying flat on the floor. Repeat __________ times. Complete this exercise __________ times per day.  RANGE OF MOTION - Extension, Prone Press Ups  Lie on your stomach on the floor, a bed will be too soft. Place your palms about shoulder width apart and at the height of your head.  Keeping your back as relaxed as possible, slowly straighten your elbows while keeping your hips on the floor. You may adjust the placement of your hands to maximize your comfort. As you gain motion, your hands will come more underneath your shoulders.  Hold this position __________ seconds.  Slowly return to lying flat on the floor. Repeat __________ times. Complete this exercise __________ times per day.    RANGE OF MOTION- Quadruped, Neutral Spine   Assume a hands and knees position on a firm surface. Keep your hands under your shoulders and your knees under your hips. You may place padding under your knees for comfort.  Drop your head and point your tail bone toward the ground below you. This will round out your lower back like an angry cat. Hold this position for __________ seconds.  Slowly lift your head and release your tail bone so that your back sags into a large arch, like an old horse.  Hold this position for __________ seconds.  Repeat this until you feel limber in your lower back.  Now, find your "sweet spot." This will be the most comfortable position somewhere between the two previous positions. This is your neutral spine. Once you have found this position, tense your stomach muscles to support your lower back.  Hold this position for __________ seconds. Repeat __________ times. Complete this exercise __________ times per day.  STRENGTHENING EXERCISES - Low Back Strain These exercises may help you when beginning to rehabilitate your injury. These exercises should be done near your "sweet spot." This is the neutral, low-back arch, somewhere between fully rounded and fully arched, that is your least painful position. When performed in this safe range of motion, these exercises can be used for people who have either a flexion or extension based injury. These exercises may resolve your symptoms with or without further involvement from your physician, physical therapist or athletic trainer. While completing these exercises, remember:  Muscles can gain both the endurance and the strength needed for everyday activities through controlled exercises.  Complete these exercises as instructed by your physician, physical therapist or athletic trainer. Increase the resistance and repetitions only as guided.  You may experience muscle soreness or fatigue, but the pain or discomfort you are  trying to eliminate should never worsen during these exercises. If this pain does worsen, stop and make certain you are following the directions exactly. If the pain is still present after adjustments, discontinue the exercise until you can discuss the trouble with your caregiver. STRENGTHENING - Deep Abdominals, Pelvic Tilt  Lie on a firm bed or floor. Keeping your legs in front of you, bend your knees so they are both pointed toward the ceiling and your feet are flat on the floor.  Tense your lower abdominal muscles to press your lower back into the floor. This motion will rotate your pelvis so that your tail bone is scooping upwards rather than pointing at your feet or into the floor.  With a gentle tension and even breathing, hold this position for __________ seconds. Repeat __________ times. Complete this exercise __________ times per day.  STRENGTHENING - Abdominals, Crunches   Lie on a firm bed or floor. Keeping your legs in front of you, bend your knees so they are both pointed toward the ceiling and your feet are flat on the floor. Cross your arms over your chest.  Slightly tip your chin down without bending your neck.  Tense your abdominals and slowly lift your trunk high enough to just clear your shoulder blades. Lifting higher can put excessive stress on the lower back and does not further strengthen your abdominal muscles.  Control your return to the starting position. Repeat __________ times. Complete this exercise __________ times per day.  STRENGTHENING - Quadruped, Opposite UE/LE Lift   Assume a hands and knees position on a firm surface. Keep your hands under your shoulders and your knees under your hips. You may place padding under your knees for comfort.  Find your neutral spine and gently tense your abdominal muscles so that you can maintain this position. Your shoulders and hips should form a rectangle that is parallel with the floor and is not twisted.  Keeping your  trunk steady, lift your right hand no higher than your shoulder and then your left leg no higher than your hip. Make sure you are not holding your breath. Hold this position __________ seconds.  Continuing to keep your abdominal muscles tense and your back steady, slowly return to your starting position. Repeat with the opposite arm and leg. Repeat __________ times. Complete this exercise __________ times per day.  STRENGTHENING - Lower Abdominals, Double Knee Lift  Lie on a firm bed or floor. Keeping your legs in front of you, bend your knees so they are both pointed toward the ceiling and your feet are flat on the floor.  Tense your abdominal muscles to brace your lower back and slowly lift both of your knees until they come over your hips. Be certain not to hold your breath.  Hold __________ seconds. Using your abdominal muscles, return to the starting position in a slow and controlled manner. Repeat __________ times. Complete this exercise __________ times per day.  POSTURE AND BODY MECHANICS CONSIDERATIONS - Low Back Strain Keeping correct posture when sitting, standing or completing your activities will reduce the stress put on different body tissues, allowing injured tissues a chance to heal and limiting painful experiences. The  following are general guidelines for improved posture. Your physician or physical therapist will provide you with any instructions specific to your needs. While reading these guidelines, remember:  The exercises prescribed by your provider will help you have the flexibility and strength to maintain correct postures.  The correct posture provides the best environment for your joints to work. All of your joints have less wear and tear when properly supported by a spine with good posture. This means you will experience a healthier, less painful body.  Correct posture must be practiced with all of your activities, especially prolonged sitting and standing. Correct  posture is as important when doing repetitive low-stress activities (typing) as it is when doing a single heavy-load activity (lifting). RESTING POSITIONS Consider which positions are most painful for you when choosing a resting position. If you have pain with flexion-based activities (sitting, bending, stooping, squatting), choose a position that allows you to rest in a less flexed posture. You would want to avoid curling into a fetal position on your side. If your pain worsens with extension-based activities (prolonged standing, working overhead), avoid resting in an extended position such as sleeping on your stomach. Most people will find more comfort when they rest with their spine in a more neutral position, neither too rounded nor too arched. Lying on a non-sagging bed on your side with a pillow between your knees, or on your back with a pillow under your knees will often provide some relief. Keep in mind, being in any one position for a prolonged period of time, no matter how correct your posture, can still lead to stiffness. PROPER SITTING POSTURE In order to minimize stress and discomfort on your spine, you must sit with correct posture. Sitting with good posture should be effortless for a healthy body. Returning to good posture is a gradual process. Many people can work toward this most comfortably by using various supports until they have the flexibility and strength to maintain this posture on their own. When sitting with proper posture, your ears will fall over your shoulders and your shoulders will fall over your hips. You should use the back of the chair to support your upper back. Your lower back will be in a neutral position, just slightly arched. You may place a small pillow or folded towel at the base of your lower back for support.  When working at a desk, create an environment that supports good, upright posture. Without extra support, muscles tire, which leads to excessive strain on  joints and other tissues. Keep these recommendations in mind: CHAIR:  A chair should be able to slide under your desk when your back makes contact with the back of the chair. This allows you to work closely.  The chair's height should allow your eyes to be level with the upper part of your monitor and your hands to be slightly lower than your elbows. BODY POSITION  Your feet should make contact with the floor. If this is not possible, use a foot rest.  Keep your ears over your shoulders. This will reduce stress on your neck and lower back. INCORRECT SITTING POSTURES  If you are feeling tired and unable to assume a healthy sitting posture, do not slouch or slump. This puts excessive strain on your back tissues, causing more damage and pain. Healthier options include:  Using more support, like a lumbar pillow.  Switching tasks to something that requires you to be upright or walking.  Talking a brief walk.  Lying down  to rest in a neutral-spine position. PROLONGED STANDING WHILE SLIGHTLY LEANING FORWARD  When completing a task that requires you to lean forward while standing in one place for a long time, place either foot up on a stationary 2-4 inch high object to help maintain the best posture. When both feet are on the ground, the lower back tends to lose its slight inward curve. If this curve flattens (or becomes too large), then the back and your other joints will experience too much stress, tire more quickly, and can cause pain. CORRECT STANDING POSTURES Proper standing posture should be assumed with all daily activities, even if they only take a few moments, like when brushing your teeth. As in sitting, your ears should fall over your shoulders and your shoulders should fall over your hips. You should keep a slight tension in your abdominal muscles to brace your spine. Your tailbone should point down to the ground, not behind your body, resulting in an over-extended swayback posture.   INCORRECT STANDING POSTURES  Common incorrect standing postures include a forward head, locked knees and/or an excessive swayback. WALKING Walk with an upright posture. Your ears, shoulders and hips should all line-up. PROLONGED ACTIVITY IN A FLEXED POSITION When completing a task that requires you to bend forward at your waist or lean over a low surface, try to find a way to stabilize 3 out of 4 of your limbs. You can place a hand or elbow on your thigh or rest a knee on the surface you are reaching across. This will provide you more stability so that your muscles do not fatigue as quickly. By keeping your knees relaxed, or slightly bent, you will also reduce stress across your lower back. CORRECT LIFTING TECHNIQUES DO :   Assume a wide stance. This will provide you more stability and the opportunity to get as close as possible to the object which you are lifting.  Tense your abdominals to brace your spine. Bend at the knees and hips. Keeping your back locked in a neutral-spine position, lift using your leg muscles. Lift with your legs, keeping your back straight.  Test the weight of unknown objects before attempting to lift them.  Try to keep your elbows locked down at your sides in order get the best strength from your shoulders when carrying an object.  Always ask for help when lifting heavy or awkward objects. INCORRECT LIFTING TECHNIQUES DO NOT:   Lock your knees when lifting, even if it is a small object.  Bend and twist. Pivot at your feet or move your feet when needing to change directions.  Assume that you can safely pick up even a paper clip without proper posture.   This information is not intended to replace advice given to you by your health care provider. Make sure you discuss any questions you have with your health care provider.   Document Released: 05/24/2005 Document Revised: 06/14/2014 Document Reviewed: 09/05/2008 Elsevier Interactive Patient Education [employer].

## 2015-11-13 ENCOUNTER — Other Ambulatory Visit: Payer: Self-pay | Admitting: Family Medicine

## 2015-12-05 ENCOUNTER — Other Ambulatory Visit: Payer: Self-pay | Admitting: Family Medicine

## 2016-05-13 ENCOUNTER — Other Ambulatory Visit: Payer: Self-pay | Admitting: *Deleted

## 2016-05-13 DIAGNOSIS — N390 Urinary tract infection, site not specified: Secondary | ICD-10-CM

## 2016-05-14 ENCOUNTER — Ambulatory Visit (INDEPENDENT_AMBULATORY_CARE_PROVIDER_SITE_OTHER): Payer: Commercial Managed Care - HMO | Admitting: Urology

## 2016-05-14 ENCOUNTER — Encounter: Payer: Self-pay | Admitting: Urology

## 2016-05-14 ENCOUNTER — Other Ambulatory Visit
Admission: RE | Admit: 2016-05-14 | Discharge: 2016-05-14 | Disposition: A | Discharge status: Home / Self Care | Payer: 59 | Source: Ambulatory Visit | Attending: Urology | Admitting: Urology

## 2016-05-14 VITALS — BP 113/60 | HR 98 | Ht 65.5 in | Wt 188.0 lb

## 2016-05-14 DIAGNOSIS — R350 Frequency of micturition: Secondary | ICD-10-CM | POA: Diagnosis not present

## 2016-05-14 DIAGNOSIS — N390 Urinary tract infection, site not specified: Secondary | ICD-10-CM | POA: Insufficient documentation

## 2016-05-14 DIAGNOSIS — R300 Dysuria: Secondary | ICD-10-CM

## 2016-05-14 LAB — URINALYSIS, COMPLETE (UACMP) WITH MICROSCOPIC
BILIRUBIN URINE: NEGATIVE
Glucose, UA: NEGATIVE mg/dL
HGB URINE DIPSTICK: NEGATIVE
Ketones, ur: NEGATIVE mg/dL
Leukocytes, UA: NEGATIVE
NITRITE: NEGATIVE
PH: 7 (ref 5.0–8.0)
Protein, ur: NEGATIVE mg/dL
RBC / HPF: NONE SEEN RBC/hpf (ref 0–5)
Specific Gravity, Urine: 1.025 (ref 1.005–1.030)

## 2016-05-14 NOTE — Progress Notes (Signed)
05/14/2016 6:13 PM   April [name] Jan 20, 1986 [id number]  Referring provider: Ignacia MarvelKrishan Sumit Angrish, DO [address] [facility] [place], [place] [id number]  Chief Complaint  Patient presents with  . Recurrent UTI    new patient referred by Dr. Dalbert GarnetBeasley    HPI: Patient is a [age] year old Caucasian female who is referred by Dr. Dalbert GarnetBeasley for recurrent UTI's.   She states that she has had bladder pain off and on for the last year.  She has a history of endometriosis and had bouts of recurrent UTI's in the early 2000's.    She had been on a trial of prazosin for nightmares and experienced an increase in her frequency and nocturia.  The prazosin was discontinued, but her symptoms persisted.    She has been taking OTC pyridium which does help with her symptoms.    She is having an IUD placed next week.    She endorses dysuria, back pain, frequency (3 times daily), urgency, nocturia x 1, intermittency, hesitancy, straining to urinate and a weak urinary stream.   She has not had any recent fevers, chills, nausea or vomiting.     She does not have a history of nephrolithiasis, GU surgery or GU trauma.   Reviewing her records,  she has not had any recent documented positive urine cultures.     She is sexually active.  She has not noted a correlation with her urinary tract infections and sexual intercourse.    She denies constipation and/or diarrhea.  She does use tampons.  She does engage in good perineal hygiene. She does not take tub baths.   She does not have incontinence.   She is having pain with bladder filling.    She had a contrast CT in 2016 which did not identify any GU findings.    She is drinking 4 to 5 bottles of water daily.  She drinks limited amount of caffeine.    She has seen an urologist in the past and was placed on suppressive antibiotics for several months.   PMH: Past Medical History:  Diagnosis Date  . Anemia   . Anxiety   . ASTHMA UNSPECIFIED WITH  EXACERBATION 01/29/2008  . Chronic fatigue syndrome   . Depression   . Dysautonomia   . Endometriosis   . Fibromyalgia   . Fibromyalgia syndrome   . Heartburn   . Kidney infection   . Subclinical hypothyroidism     Surgical History: Past Surgical History:  Procedure Laterality Date  . Laparascopic     Endometrial scar tissue removed  . WISDOM TOOTH EXTRACTION      Home Medications:    Medication List       Accurate as of 05/14/16 11:59 PM. Always use your most recent med list.          buPROPion 300 MG 24 hr tablet Commonly known as:  WELLBUTRIN XL TAKE 1 TABLET BY MOUTH EVERY DAY   etonogestrel-ethinyl estradiol 0.12-0.015 MG/24HR vaginal ring Commonly known as:  NUVARING Place 1 each vaginally every 28 (twenty-eight) days. Insert vaginally and leave in place for 3 consecutive weeks, then remove for 1 week.   fexofenadine 60 MG tablet Commonly known as:  ALLEGRA Take 60 mg by mouth.   FLUoxetine 40 MG capsule Commonly known as:  PROZAC Take 1 capsule (40 mg total) by mouth daily.   fluticasone 50 MCG/ACT nasal spray Commonly known as:  FLONASE Place into the nose.   gabapentin  100 MG capsule Commonly known as:  NEURONTIN Take 300 mg by mouth.   hydrOXYzine 50 MG tablet Commonly known as:  ATARAX/VISTARIL Take by mouth.   loperamide 2 MG capsule Commonly known as:  IMODIUM Take by mouth.   loratadine 10 MG tablet Commonly known as:  CLARITIN Take 10 mg by mouth.   NUCYNTA 50 MG tablet Generic drug:  tapentadol Take 50 mg by mouth as needed.   omeprazole 40 MG capsule Commonly known as:  PRILOSEC TAKE ONE CAPSULE BY MOUTH EVERY DAY   ondansetron 4 MG disintegrating tablet Commonly known as:  ZOFRAN-ODT Take by mouth.   traMADol 50 MG tablet Commonly known as:  ULTRAM Take 100 mg by mouth every 6 (six) hours as needed.   traZODone 50 MG tablet Commonly known as:  DESYREL TAKE 1 TABLET BY MOUTH EVERY NIGHT AT BEDTIME AS NEEDED FOR SLEEP    VENTOLIN HFA 108 (90 Base) MCG/ACT inhaler Generic drug:  albuterol Inhale into the lungs.       Allergies:  Allergies  Allergen Reactions  . Amoxicillin Other (See Comments)  . Aripiprazole   . Codeine   . Lamotrigine   . Zolpidem Tartrate     Family History: Family History  Problem Relation Age of Onset  . Depression Mother   . Prostate cancer Neg Hx   . Kidney cancer Neg Hx   . Bladder Cancer Neg Hx     Social History:  reports that she has never smoked. She has never used smokeless tobacco. She reports that she does not drink alcohol or use drugs.  ROS: UROLOGY Frequent Urination?: Yes Hard to postpone urination?: Yes Burning/pain with urination?: Yes Get up at night to urinate?: Yes Leakage of urine?: No Urine stream starts and stops?: Yes Trouble starting stream?: Yes Do you have to strain to urinate?: Yes Blood in urine?: No Urinary tract infection?: No Sexually transmitted disease?: No Injury to kidneys or bladder?: No Painful intercourse?: No Weak stream?: Yes Currently pregnant?: No Vaginal bleeding?: No Last menstrual period?: 05/10/2016  Gastrointestinal Nausea?: No Vomiting?: No Indigestion/heartburn?: No Diarrhea?: No Constipation?: No  Constitutional Fever: No Night sweats?: No Weight loss?: No Fatigue?: Yes  Skin Skin rash/lesions?: No Itching?: Yes  Eyes Blurred vision?: No Double vision?: No  Ears/Nose/Throat Sore throat?: No Sinus problems?: Yes  Hematologic/Lymphatic Swollen glands?: No Easy bruising?: Yes  Cardiovascular Leg swelling?: No Chest pain?: No  Respiratory Cough?: No Shortness of breath?: No  Endocrine Excessive thirst?: No  Musculoskeletal Back pain?: No Joint pain?: No  Neurological Headaches?: Yes Dizziness?: Yes  Psychologic Depression?: No Anxiety?: No  Physical Exam: BP 113/60   Pulse 98   Ht 5' 5.5" (1.664 m)   Wt 188 lb (85.3 kg)   LMP 05/10/2016   BMI 30.81 kg/m     Constitutional: Well nourished. Alert and oriented, No acute distress. HEENT: [place] AT, moist mucus membranes. Trachea midline, no masses. Cardiovascular: No clubbing, cyanosis, or edema. Respiratory: Normal respiratory effort, no increased work of breathing. GI: Abdomen is soft, non tender, non distended, no abdominal masses. Liver and spleen not palpable.  No hernias appreciated.  Stool sample for occult testing is not indicated.   GU: No CVA tenderness.  No bladder fullness or masses.  Normal external genitalia, normal pubic hair distribution, no lesions.  Normal urethral meatus, no lesions, no prolapse, no discharge.   No urethral masses, tenderness and/or tenderness. No bladder fullness, tenderness or masses. Normal vagina mucosa, good estrogen effect, no  discharge, no lesions, good pelvic support, no cystocele or rectocele noted.  No cervical motion tenderness.  Uterus is freely mobile and non-fixed.  No adnexal/parametria masses or tenderness noted.  Anus and perineum are without rashes or lesions.   Nuvaring in place.   Skin: No rashes, bruises or suspicious lesions. Lymph: No cervical or inguinal adenopathy. Neurologic: Grossly intact, no focal deficits, moving all 4 extremities. Psychiatric: Normal mood and affect.  Laboratory Data: Lab Results  Component Value Date   WBC 5.9 10/25/2014   HGB 11.5 (L) 10/25/2014   HCT 34.0 (L) 10/25/2014   MCV 79.6 (L) 10/25/2014   PLT 305 10/25/2014    Lab Results  Component Value Date   CREATININE 0.85 10/25/2014    Lab Results  Component Value Date   TSH 0.62 07/03/2013     Lab Results  Component Value Date   AST 20 10/25/2014   Lab Results  Component Value Date   ALT 13 (L) 10/25/2014     Urinalysis Unremarkable.  See EPIC.   Assessment & Plan:    1. Frequency  - offered behavioral therapies, bladder training, bladder control strategies, pelvic floor muscle training- patient declined  - fluid management - encouraged to  continue to drink water  - offered medical therapy with anticholinergic therapy or beta-3 adrenergic receptor agonist and the potential side effects of each therapy   - would like to try the beta-3 adrenergic receptor agonist (Myrbetriq).  Given Myrbetriq 25 mg samples, #28.  I have reviewed with the patient of the side effects of Myrbetriq, such as: elevation in BP, urinary retention and/or HA.  She will return in one month for PVR and symptom recheck.    - RTC in 3 weeks for PVR and symptom recheck   2. Dysuria  - see above  - explained the condition of IC and how it is a diagnosis of exclusion and that it is not curable, but attempts are made at symptom control   Return in about 3 weeks (around 06/04/2016) for PVR and OAB questionnaire.  These notes generated with voice recognition software. I apologize for typographical errors.  Michiel CowboySHANNON Tawny Raspberry, PA-C  [facility] [address], [address] [place], [place] [address] [phone]

## 2016-06-11 ENCOUNTER — Ambulatory Visit: Payer: Commercial Managed Care - HMO | Admitting: Urology

## 2016-08-04 ENCOUNTER — Ambulatory Visit (INDEPENDENT_AMBULATORY_CARE_PROVIDER_SITE_OTHER): Payer: Commercial Managed Care - HMO | Admitting: Obstetrics and Gynecology

## 2016-08-04 ENCOUNTER — Encounter: Payer: Self-pay | Admitting: Obstetrics and Gynecology

## 2016-08-04 VITALS — BP 122/70 | HR 86 | Ht 66.0 in | Wt 188.0 lb

## 2016-08-04 DIAGNOSIS — Z30431 Encounter for routine checking of intrauterine contraceptive device: Secondary | ICD-10-CM

## 2016-08-04 NOTE — Progress Notes (Signed)
History of Present Illness:  April [name] is a 31 y.o. that had a Mirena IUD placed approximately 4 weeks ago. Since that time, she states she has had minimal cramping and pink d/c. She denies dyspareunia, pelvic pain, non-menstrual bleeding, vaginal d/c, heavy bleeding.    ROS  Physical Exam:  BP 122/70 (BP Location: Right Arm, Patient Position: Sitting)   Pulse 86   Ht 5\' 6"  (1.676 m)   Wt 188 lb (85.3 kg)   BMI 30.34 kg/m  Body mass index is 30.34 kg/m.  Pelvic exam:  Two IUD strings present seen coming from the cervical os. EGBUS, vaginal vault and cervix: within normal limits   Assessment:  Routine checking of IUD   IUD strings present in proper location; pt doing well  Plan: F/u if any signs of infection or can no longer feel the strings.   Alicia B. Copland, PA-C 08/04/2016 2:00 PM

## 2016-08-04 NOTE — Patient Instructions (Signed)
Please call us if you notice any signs of infection or can no longer feel the strings.

## 2016-09-14 ENCOUNTER — Ambulatory Visit: Payer: Commercial Managed Care - HMO | Admitting: Obstetrics and Gynecology

## 2016-09-27 ENCOUNTER — Ambulatory Visit (INDEPENDENT_AMBULATORY_CARE_PROVIDER_SITE_OTHER): Payer: Commercial Managed Care - HMO | Admitting: Obstetrics and Gynecology

## 2016-09-27 VITALS — BP 110/70 | HR 84 | Ht 66.0 in | Wt 186.0 lb

## 2016-09-27 DIAGNOSIS — R102 Pelvic and perineal pain: Secondary | ICD-10-CM

## 2016-09-27 DIAGNOSIS — R197 Diarrhea, unspecified: Secondary | ICD-10-CM

## 2016-09-27 DIAGNOSIS — N809 Endometriosis, unspecified: Secondary | ICD-10-CM

## 2016-09-27 NOTE — Progress Notes (Signed)
Chief Complaint  Patient presents with  . Pelvic Pain    f/u pelvic pain - right ovarian cyst found, cramping, sharp pains, passing large clots/things    HPI:      Ms. April [name] is a 31 y.o. No obstetric history on file. who LMP was No LMP recorded (lmp unknown)., due to IUD, presents today for f/u pelvic pain. Mirena placed 1/18. Pt has done well. Had 1 period but only occas spotting now. No vag d/c, irritation, odor currently.  She was seen at [facility] MD for back pain/flank pain 09/07/16. She was treated for UTI wk before at urgent care. She had neg CT scan for kidney stones. Did see 1.8 cm complex cyst/follicle RTO on GYN u/s. She had neg gon/chlam/preg tests at ED.  Pt with a several yr hx of BLQ sharp, constant, stabbing pains, just getting worse. She also has episodic, severe diarrhea with vomiting, feeling like "her guts are getting ripped out".  Sx occur about 2 times weekly. She has a hx of endometriosis (diagnosed with dx lap on RTO). She has seen GI at [facility] and had neg eval. MD is concerned sx are related to endometriosis and pt should f/u with GYN.  She was on nuvaring till 8/18. Sx increasing now. Doing well with IUD and pt doesn't think pains/sx are from IUD.  There is a FH of endometriosis in her mom, mat aunt, and MGM. Pt's mom didn't do well with Lupron, so pt concerned about that. Pt also young and nullip, so fertility preservation is a concern too.   Review of Systems  Constitutional: Positive for fatigue. Negative for fever.  Gastrointestinal: Positive for diarrhea, nausea and vomiting. Negative for blood in stool and constipation.  Genitourinary: Positive for vaginal bleeding. Negative for dyspareunia, dysuria, flank pain, frequency, hematuria, urgency, vaginal discharge and vaginal pain.  Musculoskeletal: Positive for back pain.  Skin: Negative for rash.    Patient Active Problem List   Diagnosis Date Noted  . Subclinical hypothyroidism   . Fibromyalgia syndrome   .  Depression   . NAUSEA AND VOMITING 09/22/2009  . UNSPECIFIED INTESTINAL HELMINTHIASIS 07/25/2009  . FECAL IMPACTION 07/24/2009  . NAUSEA 07/24/2009  . ABDOMINAL PAIN -GENERALIZED 07/24/2009  . NONSPECIFIC ABNORMAL FINDING IN STOOL CONTENTS 07/24/2009  . ABDOMINAL PAIN RIGHT UPPER QUADRANT 05/22/2008  . BIPOLAR AFFECTIVE DISORDER 05/17/2008  . ANXIETY DISORDER 05/17/2008  . DEPRESSION 05/17/2008  . RESTLESS LEG SYNDROME 05/17/2008  . GERD 05/17/2008  . ENDOMETRIOSIS 05/17/2008  . FIBROMYALGIA 05/17/2008  . CHRONIC FATIGUE SYNDROME 05/17/2008  . ASTHMA UNSPECIFIED WITH EXACERBATION 01/29/2008    Family History  Problem Relation Age of Onset  . Depression Mother   . Prostate cancer Neg Hx   . Kidney cancer Neg Hx   . Bladder Cancer Neg Hx     Social History   Social History  . Marital status: Single    Spouse name: N/A  . Number of children: N/A  . Years of education: N/A   Occupational History  . Not on file.   Social History Main Topics  . Smoking status: Never Smoker  . Smokeless tobacco: Never Used  . Alcohol use No  . Drug use: No  . Sexual activity: Yes    Birth control/ protection: IUD   Other Topics Concern  . Not on file   Social History Narrative   ** Merged History Encounter **         Current Outpatient Prescriptions:  .  buPROPion (WELLBUTRIN XL) 300 MG 24 hr tablet, TAKE 1 TABLET BY MOUTH EVERY DAY, Disp: 30 tablet, Rfl: 5 .  cetirizine (ZYRTEC) 10 MG tablet, Take 10 mg by mouth daily., Disp: , Rfl:  .  FLUoxetine (PROZAC) 40 MG capsule, Take 1 capsule (40 mg total) by mouth daily., Disp: 30 capsule, Rfl: 2 .  gabapentin (NEURONTIN) 100 MG capsule, Take 300 mg by mouth., Disp: , Rfl:  .  levonorgestrel (MIRENA) 20 MCG/24HR IUD, 1 each by Intrauterine route once., Disp: , Rfl:  .  loperamide (IMODIUM) 2 MG capsule, Take by mouth., Disp: , Rfl:  .  omeprazole (PRILOSEC) 40 MG capsule, TAKE ONE CAPSULE BY MOUTH EVERY DAY, Disp: 30 capsule, Rfl:  5 .  ondansetron (ZOFRAN-ODT) 4 MG disintegrating tablet, Take by mouth., Disp: , Rfl:  .  SUMAtriptan (IMITREX) 50 MG tablet, Take 50 mg by mouth daily as needed., Disp: , Rfl:  .  tapentadol (NUCYNTA) 50 MG tablet, Take 50 mg by mouth daily as needed., Disp: , Rfl:  .  traZODone (DESYREL) 100 MG tablet, Take 100 mg by mouth daily., Disp: , Rfl:   OBJECTIVE:   Vitals:  BP 110/70 (Patient Position: Sitting)   Pulse 84   Ht  (1.676 m)   Wt 186 lb (84.4 kg)   LMP  (LMP Unknown) Comment: iud  BMI 30.02 kg/m   Physical Exam  Constitutional: She is oriented to person, place, and time and well-developed, well-nourished, and in no distress. Vital signs are normal.  Genitourinary: Vagina normal, uterus normal, cervix normal, right adnexa normal, left adnexa normal and vulva normal. Uterus is not enlarged. Cervix exhibits no motion tenderness and no tenderness. Right adnexum displays no mass and no tenderness. Left adnexum displays no mass and no tenderness. Vulva exhibits no erythema, no exudate, no lesion, no rash and no tenderness. Vagina exhibits no lesion.  Neurological: She is oriented to person, place, and time.  Vitals reviewed.  ED RECORDS REVIEWED  Assessment/Plan: Pelvic pain in female - Refer to MD for further eval. Discussed lack of systemic hormoneal control as possible etiology of increasing pain. Discussed lupron/hyst/etc.   Endometriosis  Diarrhea, unspecified type - Discussed lupron to see if helps sx. If so, then could be endometriosis related. If no sx improvement, then pt should f/u again with GI.    Return in about 1 week (around 10/04/2016) for with MD for endometriosis conf.  Biff Rutigliano B. Wilmore Holsomback, PA-C 09/27/2016 5:05 PM

## 2016-10-07 ENCOUNTER — Encounter: Payer: Self-pay | Admitting: Obstetrics and Gynecology

## 2016-10-07 ENCOUNTER — Ambulatory Visit (INDEPENDENT_AMBULATORY_CARE_PROVIDER_SITE_OTHER): Payer: Commercial Managed Care - HMO | Admitting: Obstetrics and Gynecology

## 2016-10-07 VITALS — BP 106/70 | HR 72 | Ht 66.0 in | Wt 185.0 lb

## 2016-10-07 DIAGNOSIS — N809 Endometriosis, unspecified: Secondary | ICD-10-CM

## 2016-10-07 DIAGNOSIS — R102 Pelvic and perineal pain: Secondary | ICD-10-CM | POA: Diagnosis not present

## 2016-10-07 DIAGNOSIS — G8929 Other chronic pain: Secondary | ICD-10-CM | POA: Diagnosis not present

## 2016-10-07 NOTE — Progress Notes (Signed)
Obstetrics & Gynecology Office Visit   Chief Complaint:  Chief Complaint  Patient presents with  . Conference regarding Endometriosis    pelvic pain/ history of Endometriosis    History of Present Illness: 31 year old with chronic pelvic pain, fibromylgia, and endometriosis [name]  She was initially managed with initial management via patch, had problem with adhesive then switched to Nuvaring with withdrawal bleeds elicited every 3 months.  Did well on this for about 6 years.  Started having some problems and switched to OCP most recently had Mirena IUD placed January 2018.    She feels her pelvic pain symptoms have worsened in the past year.  She reports bilateral "ovarian pain", sharp and stabbing in quality, no radiation, right greater then left, and 5/10 at baseline but worsening to 10/10.  She is currently also being worked up for some GI issues.  Symptoms are daily.  Prior to Mirena there was worsening around the time of menses, she has achieved amenorrhea on Mirena.  She is currently no gabapentin 100mg  daily for her fibromyalgia.  [facility]16 underwent imaging showing a 1.8cm complex cyst or folllicle in the right ovary and some complex fluid in the left adnexa without visualization of the left ovary.   Review of Systems: Review of Systems  Constitutional: Negative for chills and fever.  Gastrointestinal: Positive for abdominal pain and constipation. Negative for blood in stool, diarrhea, melena, nausea and vomiting.  Otherwise negative unless noted in HPI  Past Medical History:  Past Medical History:  Diagnosis Date  . Acid reflux   . Anemia   . Anxiety   . ASTHMA UNSPECIFIED WITH EXACERBATION 01/29/2008  . Chronic fatigue syndrome   . Depression   . Dysautonomia   . Endometriosis   . Fibromyalgia   . Fibromyalgia syndrome   . Heartburn   . Kidney infection   . Ovarian cyst   . Subclinical hypothyroidism       Past Surgical History:  Past Surgical History:  Procedure Laterality Date  . Laparascopic     Endometrial scar tissue removed  . WISDOM TOOTH EXTRACTION      Gynecologic History: No LMP recorded (lmp unknown). Patient is not currently having periods (Reason: IUD).  Obstetric History: No obstetric history on file.  Family History:  Family History  Problem Relation Age of Onset  . Depression Mother   . Prostate cancer Neg Hx   . Kidney cancer Neg Hx   . Bladder Cancer Neg Hx     Social History:  Social History   Social History  . Marital status: Single    Spouse name: N/A  . Number of children: N/A  . Years of education: N/A   Occupational History  . Not on file.   Social History Main Topics  . Smoking status: Never Smoker  . Smokeless tobacco: Never Used  . Alcohol use No  . Drug use: No  . Sexual activity: Yes    Birth control/ protection: IUD   Other Topics Concern  . Not on file   Social History Narrative   ** Merged History Encounter **        Allergies:  Allergies  Allergen Reactions  . Amoxicillin Other (See Comments)  . Aripiprazole   . Codeine   . Lamotrigine   . Zolpidem Tartrate     Medications: Prior to Admission medications   Medication Sig Start Date End Date  Taking? Authorizing Provider  buPROPion (WELLBUTRIN XL) 300 MG 24 hr tablet TAKE 1 TABLET BY MOUTH EVERY DAY 06/04/15   Duanne Limerick, MD  cetirizine (ZYRTEC) 10 MG tablet Take 10 mg by mouth daily.    Historical Provider, MD  FLUoxetine (PROZAC) 40 MG capsule Take 1 capsule (40 mg total) by mouth daily. 01/30/14   Donita Brooks, MD  gabapentin (NEURONTIN) 100 MG capsule Take 300 mg by mouth.    Historical Provider, MD  levonorgestrel (MIRENA) 20 MCG/24HR IUD 1 each by Intrauterine route once.    Historical Provider, MD  loperamide (IMODIUM) 2 MG capsule Take by mouth.    Historical Provider, MD  omeprazole (PRILOSEC) 40 MG capsule TAKE ONE CAPSULE BY MOUTH EVERY DAY  06/04/15   Duanne Limerick, MD  ondansetron (ZOFRAN-ODT) 4 MG disintegrating tablet Take by mouth. 06/13/15   Historical Provider, MD  SUMAtriptan (IMITREX) 50 MG tablet Take 50 mg by mouth daily as needed. 05/24/16   Historical Provider, MD  tapentadol (NUCYNTA) 50 MG tablet Take 50 mg by mouth daily as needed. 08/23/16   Historical Provider, MD  traZODone (DESYREL) 100 MG tablet Take 100 mg by mouth daily. 05/24/16   Historical Provider, MD    Physical Exam Vitals:  Vitals:   10/07/16 1511  BP: 106/70  Pulse: 72   No LMP recorded (lmp unknown). Patient is not currently having periods (Reason: IUD).  General: NAD HEENT: normocephalic, anicteric Pulmonary: No increased work of breathing Neurologic: Grossly intact Psychiatric: mood appropriate, affect full  Female chaperone present for pelvic and breast  portions of the physical exam  Assessment: 31 y.o. No obstetric history on file. No problem-specific Assessment & Plan notes found for this encounter.   Plan: Problem List Items Addressed This Visit    None     1) Endometriosis - unclear if current symptoms are related to her prior diagnosis of endometriosis as she has been on appropriate treatment.  Mirena is also an accepted treatment for endometriosis.  She is on gabapentin and my first suggestion is to titrate up to higher dose.  She was prescribed tid doseing I told her to increase to bid dosing over the next week then move to tid dosing or 100mg  AM 200mg  PM to simplify dosing.  Will re-evalute for response in 6-8 weeks.  We also discussed depo leupron and norethindrone.  Repeat laparoscopy to assess disease course is also reasonable. She would like to avoid surgery if possible.  If symptoms worsen or no improvement is seen it is also reasonable to re-image her right ovarian cyst to verify resolution and rule out endometrioma  2) A total of 15 minutes were spent in face-to-face contact with the patient during this encounter with  over half of that time devoted to counseling and coordination of care.

## 2016-10-27 ENCOUNTER — Other Ambulatory Visit: Payer: Self-pay | Admitting: Obstetrics and Gynecology

## 2016-10-27 ENCOUNTER — Encounter: Payer: Self-pay | Admitting: Obstetrics and Gynecology

## 2016-10-27 DIAGNOSIS — R102 Pelvic and perineal pain: Secondary | ICD-10-CM

## 2016-10-27 DIAGNOSIS — N809 Endometriosis, unspecified: Secondary | ICD-10-CM

## 2016-10-27 DIAGNOSIS — G8929 Other chronic pain: Secondary | ICD-10-CM

## 2016-10-27 NOTE — Telephone Encounter (Signed)
Needs ultrasound in the next week order should be in

## 2016-10-29 ENCOUNTER — Telehealth: Payer: Self-pay | Admitting: Obstetrics and Gynecology

## 2016-10-29 NOTE — Telephone Encounter (Signed)
Pt is schedule for ultrasound and follow up for 11/11/16

## 2016-11-03 ENCOUNTER — Telehealth: Payer: Self-pay | Admitting: Obstetrics and Gynecology

## 2016-11-03 NOTE — Telephone Encounter (Signed)
Lmtrc

## 2016-11-03 NOTE — Telephone Encounter (Signed)
-----   Message from Andreas Staebler, MD sent at 11/02/2016  8:49 AM EDT ----- °Regarding: Surgery °Surgery Date: 2-4 weeks ° °LOS: outpatient ° °Surgery Booking Request °Patient Full Name: [name], [name] °MRN: [id number]  °DOB: 01/03/1986  °Surgeon: Andreas Staebler, MD  °Requested Surgery Date and Time: 2-4 weeks °Primary Diagnosis and Code: Chronic pelvic pain °Secondary Diagnosis and Code: Endometriosis °Surgical Procedure: Diagnostic laparoscopy °L&D Notification:N/A °Admission Status: same day surgery °Length of Surgery: 1hr °Special Case Needs: none °H&P:  (date) °Phone Interview or Office Pre-Admit: phone should be fine °Interpreter: none °Language: English °Medical Clearance: none °Special Scheduling Instructions: none ° °

## 2016-11-03 NOTE — Telephone Encounter (Signed)
-----   Message from Vena AustriaAndreas Staebler, MD sent at 11/02/2016  8:49 AM EDT ----- Regarding: Surgery Surgery Date: 2-4 weeks  LOS: outpatient  Surgery Booking Request Patient Full Name: April [name], April [name] MRN: [id number]  DOB: 21-Nov-1985  Surgeon: Vena AustriaAndreas Staebler, MD  Requested Surgery Date and Time: 2-4 weeks Primary Diagnosis and Code: Chronic pelvic pain Secondary Diagnosis and Code: Endometriosis Surgical Procedure: Diagnostic laparoscopy L&D Notification:N/A Admission Status: same day surgery Length of Surgery: 1hr Special Case Needs: none H&P:  (date) Phone Interview or Office Pre-Admit: phone should be fine Interpreter: none Language: English Medical Clearance: none Special Scheduling Instructions: none

## 2016-11-05 NOTE — Telephone Encounter (Signed)
Patient is aware of H&P on 11/11/16 @ 3:30pm w/ Pre-admit Testing Phone Interview following, and OR on 11/16/16.

## 2016-11-09 ENCOUNTER — Encounter: Payer: Commercial Managed Care - HMO | Admitting: Obstetrics and Gynecology

## 2016-11-11 ENCOUNTER — Ambulatory Visit (INDEPENDENT_AMBULATORY_CARE_PROVIDER_SITE_OTHER): Payer: Commercial Managed Care - HMO | Admitting: Obstetrics and Gynecology

## 2016-11-11 ENCOUNTER — Encounter: Payer: Self-pay | Admitting: Obstetrics and Gynecology

## 2016-11-11 ENCOUNTER — Ambulatory Visit (INDEPENDENT_AMBULATORY_CARE_PROVIDER_SITE_OTHER): Payer: Commercial Managed Care - HMO

## 2016-11-11 VITALS — BP 100/56 | HR 92 | Ht 66.0 in | Wt 183.0 lb

## 2016-11-11 DIAGNOSIS — G8929 Other chronic pain: Secondary | ICD-10-CM

## 2016-11-11 DIAGNOSIS — Z01818 Encounter for other preprocedural examination: Secondary | ICD-10-CM

## 2016-11-11 DIAGNOSIS — R102 Pelvic and perineal pain: Secondary | ICD-10-CM

## 2016-11-11 DIAGNOSIS — N83201 Unspecified ovarian cyst, right side: Secondary | ICD-10-CM

## 2016-11-11 DIAGNOSIS — N809 Endometriosis, unspecified: Secondary | ICD-10-CM | POA: Diagnosis not present

## 2016-11-11 NOTE — Progress Notes (Signed)
Obstetrics & Gynecology Surgery H&P    Chief Complaint: Scheduled Surgery   History of Present Illness: Patient is a [age] y.o. No obstetric history on file. presenting for scheduled diagnostic laparoscopy, possible right ovarian cystectomy, for the treatment or further evaluation of chronic pelvic pain and endometriosis.   Prior Treatments prior to proceeding with surgery include: hormonal mangement  Preoperative Ultrasound: 11/11/16 Findings: persistent right ovarian cyst with surrounding fluid collection   Review of Systems:10 point review of systems  Past Medical History:  Past Medical History:  Diagnosis Date  . Acid reflux   . Anemia   . Anxiety   . ASTHMA UNSPECIFIED WITH EXACERBATION 01/29/2008  . Chronic fatigue syndrome   . Depression   . Dysautonomia   . Endometriosis   . Fibromyalgia   . Fibromyalgia syndrome   . Heartburn   . Kidney infection   . Ovarian cyst   . Subclinical hypothyroidism     Past Surgical History:  Past Surgical History:  Procedure Laterality Date  . Laparascopic     Endometrial scar tissue removed  . WISDOM TOOTH EXTRACTION      Family History:  Family History  Problem Relation Age of Onset  . Depression Mother   . Prostate cancer Neg Hx   . Kidney cancer Neg Hx   . Bladder Cancer Neg Hx     Social History:  Social History   Social History  . Marital status: Single    Spouse name: N/A  . Number of children: N/A  . Years of education: N/A   Occupational History  . Not on file.   Social History Main Topics  . Smoking status: Never Smoker  . Smokeless tobacco: Never Used  . Alcohol use No  . Drug use: No  . Sexual activity: Yes    Birth control/ protection: IUD   Other Topics Concern  . Not on file   Social History Narrative   ** Merged History Encounter **        Allergies:  Allergies  Allergen Reactions  . Aripiprazole     Tremors  . Ciprofloxacin     Cant take in combination of fluoxetine and  bupropion, causes those medicines to lose their efficacy   . Codeine     loopy  . Other     Bleach - eczema rashes   . Zolpidem Tartrate     Hallucinations     Medications: Prior to Admission medications   Medication Sig Start Date End Date Taking? Authorizing Provider  buPROPion (WELLBUTRIN XL) 300 MG 24 hr tablet TAKE 1 TABLET BY MOUTH EVERY DAY Patient taking differently: TAKE 1 TABLET BY MOUTH EVERY DAY IN THE EVENING 06/04/15  Yes Duanne LimerickJones, Deanna C, MD  Carboxymethylcellulose Sodium (REFRESH TEARS OP) Apply 1 drop to eye daily as needed (allergies).   Yes [provider]  cetirizine (ZYRTEC) 10 MG tablet Take 10 mg by mouth every evening.    Yes [provider]  Cholecalciferol (VITAMIN D3) 2000 units CHEW Chew 4,000 Units by mouth every evening.   Yes [provider]  FLUoxetine (PROZAC) 40 MG capsule Take 1 capsule (40 mg total) by mouth daily. Patient taking differently: Take 40 mg by mouth every evening.  01/30/14  Yes Donita BrooksPickard, Warren T, MD  gabapentin (NEURONTIN) 100 MG capsule Take 100 mg by mouth every evening.    Yes [provider]  levonorgestrel (MIRENA) 20 MCG/24HR IUD 1 each by Intrauterine route once.  Yes [provider]  loperamide (IMODIUM) 2 MG capsule Take 2 mg by mouth 2 (two) times daily as needed for diarrhea or loose stools.    Yes [provider]  omeprazole (PRILOSEC) 40 MG capsule TAKE ONE CAPSULE BY MOUTH EVERY DAY Patient taking differently: TAKE ONE CAPSULE BY MOUTH EVERY DAY IN THE EVENING 06/04/15  Yes Duanne Limerick, MD  ondansetron (ZOFRAN-ODT) 4 MG disintegrating tablet Take 4 mg by mouth every 8 (eight) hours as needed for nausea or vomiting.  06/13/15  Yes [provider]  SUMAtriptan (IMITREX) 50 MG tablet Take 50 mg by mouth daily as needed for migraine.  05/24/16  Yes [provider]  tapentadol (NUCYNTA) 50 MG tablet Take 50-100 mg by mouth daily as needed for severe pain.   08/23/16  Yes [provider]  traZODone (DESYREL) 100 MG tablet Take 100 mg by mouth at bedtime.  05/24/16  Yes [provider]    Physical Exam Vitals: Blood pressure (!) 100/56, pulse 92, height 5\' 6"  (1.676 m), weight 183 lb (83 kg). General: NAD HEENT: normocephalic, anicteric Pulmonary: CTAB, No increased work of breathing Cardiovascular: RRR, distal pulses 2+ Abdomen: soft, non-tender, non-distended Genitourinary: deferred Extremities: no edema, erythema, or tenderness Neurologic: Grossly intact Psychiatric: mood appropriate, affect full  Imaging No results found.  Assessment: 31 y.o. chronic pelvic pain, endometriosis, right ovarian cyst scheduled for diagnostic laparoscopy  Plan: 1)I have had a careful discussion with this patient about all the options available and the risk/benefits of each. I have fully informed this patient that a laparoscopy may subject her to a variety of discomforts and risks: She understands that most patients have surgery with little difficulty, but problems can happen ranging from minor to fatal. These include nausea, vomiting, pain, bleeding, infection, poor healing, hernia, or formation of adhesions. Unexpected reactions may occur from any drug or anesthetic given. Unintended injury may occur to other pelvic or abdominal structures such as Fallopian tubes, ovaries, bladder, ureter (tube from kidney to bladder), or bowel. Nerves going from the pelvis to the legs may be injured. Any such injury may require immediate or later additional surgery to correct the problem. Excessive blood loss requiring transfusion is very unlikely but possible. Dangerous blood clots may form in the legs or lungs. Physical and sexual activity will be restricted in varying degrees for an indeterminate period of time but most often 2-4 weeks. She understands that the plan is to do this laparoscopically, however, there is a chance that this will need to be performed  via a larger incision. Finally, she understands that it is impossible to list every possible undesirable effect and that the condition for which surgery is done is not always cured or significantly improved, and in rare cases may be even worsen. Ample time was given to answer all questions.   2) Routine postoperative instructions were reviewed with the patient and her family in detail today including the expected length of recovery and likely postoperative course.  The patient concurred with the proposed plan, giving informed written consent for the surgery today.  Patient instructed on the importance of being NPO after midnight prior to her procedure.  If warranted preoperative prophylactic antibiotics and SCDs ordered on call to the OR to meet SCIP guidelines and adhere to recommendation laid forth in ACOG Practice Bulletin Number 104 May 2009  "Antibiotic Prophylaxis for Gynecologic Procedures".

## 2016-11-12 ENCOUNTER — Encounter
Admission: RE | Admit: 2016-11-12 | Discharge: 2016-11-12 | Disposition: A | Discharge status: Home / Self Care | Payer: 59 | Source: Ambulatory Visit | Attending: Obstetrics and Gynecology | Admitting: Obstetrics and Gynecology

## 2016-11-16 ENCOUNTER — Ambulatory Visit: Payer: Commercial Managed Care - HMO | Admitting: Anesthesiology

## 2016-11-16 ENCOUNTER — Encounter
Admission: RE | Disposition: A | Discharge status: Home / Self Care | Payer: Self-pay | Source: Ambulatory Visit | Attending: Obstetrics and Gynecology

## 2016-11-16 ENCOUNTER — Ambulatory Visit
Admission: RE | Admit: 2016-11-16 | Discharge: 2016-11-16 | Disposition: A | Discharge status: Home / Self Care | Payer: Commercial Managed Care - HMO | Source: Ambulatory Visit | Attending: Obstetrics and Gynecology | Admitting: Obstetrics and Gynecology

## 2016-11-16 DIAGNOSIS — K219 Gastro-esophageal reflux disease without esophagitis: Secondary | ICD-10-CM | POA: Diagnosis not present

## 2016-11-16 DIAGNOSIS — Z9889 Other specified postprocedural states: Secondary | ICD-10-CM

## 2016-11-16 DIAGNOSIS — Z818 Family history of other mental and behavioral disorders: Secondary | ICD-10-CM | POA: Insufficient documentation

## 2016-11-16 DIAGNOSIS — F319 Bipolar disorder, unspecified: Secondary | ICD-10-CM | POA: Insufficient documentation

## 2016-11-16 DIAGNOSIS — F419 Anxiety disorder, unspecified: Secondary | ICD-10-CM | POA: Insufficient documentation

## 2016-11-16 DIAGNOSIS — Z79899 Other long term (current) drug therapy: Secondary | ICD-10-CM | POA: Insufficient documentation

## 2016-11-16 DIAGNOSIS — R5382 Chronic fatigue, unspecified: Secondary | ICD-10-CM | POA: Diagnosis not present

## 2016-11-16 DIAGNOSIS — J45909 Unspecified asthma, uncomplicated: Secondary | ICD-10-CM | POA: Insufficient documentation

## 2016-11-16 DIAGNOSIS — G901 Familial dysautonomia [Riley-Day]: Secondary | ICD-10-CM | POA: Diagnosis not present

## 2016-11-16 DIAGNOSIS — R102 Pelvic and perineal pain: Secondary | ICD-10-CM | POA: Insufficient documentation

## 2016-11-16 DIAGNOSIS — D649 Anemia, unspecified: Secondary | ICD-10-CM | POA: Insufficient documentation

## 2016-11-16 DIAGNOSIS — Z885 Allergy status to narcotic agent status: Secondary | ICD-10-CM | POA: Diagnosis not present

## 2016-11-16 DIAGNOSIS — G8929 Other chronic pain: Secondary | ICD-10-CM | POA: Diagnosis not present

## 2016-11-16 DIAGNOSIS — N83201 Unspecified ovarian cyst, right side: Secondary | ICD-10-CM | POA: Diagnosis not present

## 2016-11-16 DIAGNOSIS — Z87440 Personal history of urinary (tract) infections: Secondary | ICD-10-CM | POA: Insufficient documentation

## 2016-11-16 DIAGNOSIS — E02 Subclinical iodine-deficiency hypothyroidism: Secondary | ICD-10-CM | POA: Diagnosis not present

## 2016-11-16 DIAGNOSIS — M797 Fibromyalgia: Secondary | ICD-10-CM | POA: Diagnosis not present

## 2016-11-16 DIAGNOSIS — Z888 Allergy status to other drugs, medicaments and biological substances status: Secondary | ICD-10-CM | POA: Insufficient documentation

## 2016-11-16 HISTORY — PX: LAPAROSCOPY: SHX197

## 2016-11-16 HISTORY — DX: Syncope and collapse: R55

## 2016-11-16 LAB — POCT PREGNANCY, URINE: Preg Test, Ur: NEGATIVE

## 2016-11-16 SURGERY — LAPAROSCOPY, DIAGNOSTIC
Anesthesia: General

## 2016-11-16 MED ORDER — HYDROCODONE-ACETAMINOPHEN 5-325 MG PO TABS: 1.0000 | ORAL_TABLET | ORAL | 0 refills | Status: DC | PRN

## 2016-11-16 MED ORDER — MIDAZOLAM HCL 2 MG/2ML IJ SOLN
INTRAMUSCULAR | Status: DC | PRN
  Administered 2016-11-16: 2 mg via INTRAVENOUS

## 2016-11-16 MED ORDER — LIDOCAINE HCL (CARDIAC) 20 MG/ML IV SOLN
INTRAVENOUS | Status: DC | PRN
  Administered 2016-11-16: 80 mg via INTRAVENOUS

## 2016-11-16 MED ORDER — PROPOFOL 10 MG/ML IV BOLUS
INTRAVENOUS | Status: AC
  Filled 2016-11-16: qty 20

## 2016-11-16 MED ORDER — ONDANSETRON HCL 4 MG/2ML IJ SOLN
INTRAMUSCULAR | Status: DC | PRN
  Administered 2016-11-16: 4 mg via INTRAVENOUS

## 2016-11-16 MED ORDER — FENTANYL CITRATE (PF) 100 MCG/2ML IJ SOLN
INTRAMUSCULAR | Status: AC
  Filled 2016-11-16: qty 2

## 2016-11-16 MED ORDER — ONDANSETRON HCL 4 MG/2ML IJ SOLN
INTRAMUSCULAR | Status: AC
  Filled 2016-11-16: qty 2

## 2016-11-16 MED ORDER — ONDANSETRON HCL 4 MG/2ML IJ SOLN: 4.0000 mg | Freq: Once | INTRAMUSCULAR | Status: DC | PRN

## 2016-11-16 MED ORDER — IBUPROFEN 600 MG PO TABS: 600.0000 mg | ORAL_TABLET | Freq: Four times a day (QID) | ORAL | 3 refills | Status: DC | PRN

## 2016-11-16 MED ORDER — FENTANYL CITRATE (PF) 100 MCG/2ML IJ SOLN
25.0000 ug | INTRAMUSCULAR | Status: DC | PRN
  Administered 2016-11-16 (×3): 25 ug via INTRAVENOUS

## 2016-11-16 MED ORDER — ROCURONIUM BROMIDE 100 MG/10ML IV SOLN
INTRAVENOUS | Status: DC | PRN
  Administered 2016-11-16: 40 mg via INTRAVENOUS

## 2016-11-16 MED ORDER — LACTATED RINGERS IV SOLN
INTRAVENOUS | Status: DC
  Administered 2016-11-16 (×3): via INTRAVENOUS

## 2016-11-16 MED ORDER — PHENYLEPHRINE HCL 10 MG/ML IJ SOLN
INTRAMUSCULAR | Status: DC | PRN
  Administered 2016-11-16 (×2): 100 ug via INTRAVENOUS

## 2016-11-16 MED ORDER — PROPOFOL 10 MG/ML IV BOLUS
INTRAVENOUS | Status: DC | PRN
  Administered 2016-11-16: 150 mg via INTRAVENOUS

## 2016-11-16 MED ORDER — BUPIVACAINE HCL 0.5 % IJ SOLN
INTRAMUSCULAR | Status: DC | PRN
  Administered 2016-11-16: 10 mL

## 2016-11-16 MED ORDER — GLYCOPYRROLATE 0.2 MG/ML IJ SOLN
INTRAMUSCULAR | Status: DC | PRN
  Administered 2016-11-16: .4 mg via INTRAVENOUS

## 2016-11-16 MED ORDER — FENTANYL CITRATE (PF) 100 MCG/2ML IJ SOLN
INTRAMUSCULAR | Status: DC | PRN
  Administered 2016-11-16 (×2): 50 ug via INTRAVENOUS

## 2016-11-16 MED ORDER — DEXAMETHASONE SODIUM PHOSPHATE 10 MG/ML IJ SOLN
INTRAMUSCULAR | Status: DC | PRN
  Administered 2016-11-16: 5 mg via INTRAVENOUS

## 2016-11-16 MED ORDER — MIDAZOLAM HCL 2 MG/2ML IJ SOLN
INTRAMUSCULAR | Status: AC
Start: 2016-11-16 — End: 2016-11-16
  Filled 2016-11-16: qty 2

## 2016-11-16 MED ORDER — DEXMEDETOMIDINE HCL IN NACL 200 MCG/50ML IV SOLN
INTRAVENOUS | Status: DC | PRN
  Administered 2016-11-16: 12 ug via INTRAVENOUS

## 2016-11-16 MED ORDER — BUPIVACAINE HCL (PF) 0.5 % IJ SOLN
INTRAMUSCULAR | Status: AC
  Filled 2016-11-16: qty 30

## 2016-11-16 MED ORDER — NEOSTIGMINE METHYLSULFATE 10 MG/10ML IV SOLN
INTRAVENOUS | Status: DC | PRN
  Administered 2016-11-16: 3 mg via INTRAVENOUS

## 2016-11-16 SURGICAL SUPPLY — 32 items
ADH SKN CLS APL DERMABOND .7 (GAUZE/BANDAGES/DRESSINGS) ×1
BLADE SURG SZ11 CARB STEEL (BLADE) ×2 IMPLANT
CANISTER SUCT 1200ML W/VALVE (MISCELLANEOUS) ×2 IMPLANT
CATH ROBINSON RED A/P 16FR (CATHETERS) ×2 IMPLANT
CHLORAPREP W/TINT 26ML (MISCELLANEOUS) ×2 IMPLANT
DERMABOND ADVANCED (GAUZE/BANDAGES/DRESSINGS) ×1
DERMABOND ADVANCED .7 DNX12 (GAUZE/BANDAGES/DRESSINGS) ×1 IMPLANT
DRSG TEGADERM 2-3/8X2-3/4 SM (GAUZE/BANDAGES/DRESSINGS) ×2 IMPLANT
GAUZE SPONGE NON-WVN 2X2 STRL (MISCELLANEOUS) ×1 IMPLANT
GLOVE BIO SURGEON STRL SZ7 (GLOVE) ×2 IMPLANT
GLOVE INDICATOR 7.5 STRL GRN (GLOVE) ×2 IMPLANT
GOWN STRL REUS W/ TWL LRG LVL3 (GOWN DISPOSABLE) ×2 IMPLANT
GOWN STRL REUS W/TWL LRG LVL3 (GOWN DISPOSABLE) ×4
IRRIGATION STRYKERFLOW (MISCELLANEOUS) ×1 IMPLANT
IRRIGATOR STRYKERFLOW (MISCELLANEOUS) ×2
IV LACTATED RINGERS 1000ML (IV SOLUTION) ×2 IMPLANT
KIT RM TURNOVER CYSTO AR (KITS) ×2 IMPLANT
LABEL OR SOLS (LABEL) ×2 IMPLANT
NS IRRIG 500ML POUR BTL (IV SOLUTION) ×2 IMPLANT
PACK GYN LAPAROSCOPIC (MISCELLANEOUS) ×2 IMPLANT
PAD OB MATERNITY 4.3X12.25 (PERSONAL CARE ITEMS) ×2 IMPLANT
PAD PREP 24X41 OB/GYN DISP (PERSONAL CARE ITEMS) ×2 IMPLANT
SCISSORS METZENBAUM CVD 33 (INSTRUMENTS) IMPLANT
SHEARS HARMONIC ACE PLUS 36CM (ENDOMECHANICALS) ×2 IMPLANT
SLEEVE ENDOPATH XCEL 5M (ENDOMECHANICALS) IMPLANT
SPONGE VERSALON 2X2 STRL (MISCELLANEOUS) ×2
SUT MNCRL AB 4-0 PS2 18 (SUTURE) ×2 IMPLANT
SUT VIC AB 0 CT2 27 (SUTURE) ×2 IMPLANT
SYRINGE 10CC LL (SYRINGE) ×2 IMPLANT
TROCAR ENDO BLADELESS 11MM (ENDOMECHANICALS) IMPLANT
TROCAR XCEL NON-BLD 5MMX100MML (ENDOMECHANICALS) ×2 IMPLANT
TUBING INSUFFLATOR HI FLOW (MISCELLANEOUS) ×2 IMPLANT

## 2016-11-16 NOTE — H&P (Signed)
Date of Initial H&P: 11/11/2016  History reviewed, patient examined, no change in status, stable for surgery.

## 2016-11-16 NOTE — OR Nursing (Signed)
Dr. Bonney AidStaebler in to see patient prior to d/c

## 2016-11-16 NOTE — Op Note (Signed)
Preoperative Diagnosis: 1) 31 y.o. with chronic pelvic pain 2) Prior laparoscopy with findings consistent with endometriosis  Postoperative Diagnosis: 1) 31 y.o. with chronic pelvic pain 2) No evidence of endoemtriosis 3) Small 2cm functional appearing right ovarian cyst  Operation Performed: Diagnostic laparoscopy  Indication: 31 y.o. chronic pelvic pain  Surgeon: Vena AustriaAndreas Kiamesha Samet, MD  Anesthesia: General  Preoperative Antibiotics: none  Estimated Blood Loss: minimal  IV Fluids: 1L  Urine Output:: 30mL  Drains or Tubes: none  Implants: none  Specimens Removed: none  Complications: none  Intraoperative Findings: Normal tubes, ovaries, and uterus.  Normal ovarian fossa bilaterally, normal posterior and anterior cul de sac, normal appendix, normal liver edge.  No evidence of adhesive disease or hernia's. Both ureters were of normal caliber and followed a normal course into the pelvis.  Patient Condition: stable  Procedure in Detail:  Patient was taken to the operating room where she was administered general anesthesia.  She was positioned in the dorsal lithotomy position utilizing Allen stirups, prepped and draped in the usual sterile fashion.  Prior to proceeding with procedure a time out was performed.  Attention was turned to the patient's pelvis.  A red rubber catheter was used to empty the patient's bladder.  An operative speculum was placed to allow visualization of the cervix, IUD strings were present.  The anterior lip of the cervix was grasped with a single tooth tenaculum, and a Hulka tenaculum was placed to allow manipulation of the uterus.  The operative speculum and single tooth tenaculum were then removed.  Attention was turned to the patient's abdomen.  The umbilicus was infiltrated with 1% Sensorcaine, before making a stab incision using an 11 blade scalpel.  A 5mm Excel trocar was then used to gain direct entry into the peritoneal cavity utilizing the camera to  visualize progress of the trocar during placement.  Once peritoneal entry had been achieved, insufflation was started and pneumoperitoneum established at a pressure of 15mmHg.   General inspection of the abdomen revealed the above noted findings.  A in the left lower quadrant was injected with 1% Sensorcaine and a stab incision was made using an 11 blade scalpel.  A second 5mm excel trocar was place through this incision under direct visualization.  Inspection of the abdomen and pelvis noted the above findings.  The right ovarian cyst was ruptured using a blunt grasper noting clear serous fluid. Pneumoperitoneum was evacuated.  The trocars were removed.   All trocar sites were then dressed with surgical skin glue.  The Hulka tenaculum was removed.  Sponge needle and instrument counts were correct time two.  The patient tolerated the procedure well and was taken to the recovery room in stable condition.

## 2016-11-16 NOTE — Discharge Instructions (Signed)

## 2016-11-16 NOTE — Anesthesia Post-op Follow-up Note (Cosign Needed)
Anesthesia QCDR form completed.        

## 2016-11-16 NOTE — Anesthesia Postprocedure Evaluation (Signed)
Anesthesia Post Note  Patient: April MorinAmber J [name]  Procedure(s) Performed: Procedure(s) (LRB): LAPAROSCOPY DIAGNOSTIC (N/A)  Patient location during evaluation: PACU Anesthesia Type: General Level of consciousness: awake and alert Pain management: pain level controlled Vital Signs Assessment: post-procedure vital signs reviewed and stable Respiratory status: spontaneous breathing and respiratory function stable Cardiovascular status: stable Anesthetic complications: no     Last Vitals:  Vitals:   11/16/16 1125 11/16/16 1130  BP:    Pulse: (!) 59 61  Resp: 16 15  Temp:      Last Pain:  Vitals:   11/16/16 1118  TempSrc:   PainSc: Asleep                 Karyme Mcconathy K

## 2016-11-16 NOTE — Transfer of Care (Signed)
Immediate Anesthesia Transfer of Care Note  Patient: Alvester MorinAmber J Closser  Procedure(s) Performed: Procedure(s): LAPAROSCOPY DIAGNOSTIC (N/A)  Patient Location: PACU  Anesthesia Type:General  Level of Consciousness: sedated  Airway & Oxygen Therapy: Patient Spontanous Breathing and Patient connected to face mask oxygen  Post-op Assessment: Report given to RN and Post -op Vital signs reviewed and stable  Post vital signs: Reviewed and stable  Last Vitals:  Vitals:   11/16/16 0850 11/16/16 1118  BP: (!) 112/43 (!) 116/42  Pulse: 85 72  Resp: 16 20  Temp: 37.1 C 37 C    Last Pain:  Vitals:   11/16/16 1118  TempSrc:   PainSc: Asleep         Complications: No apparent anesthesia complications

## 2016-11-16 NOTE — Anesthesia Preprocedure Evaluation (Signed)
Anesthesia Evaluation  Patient identified by MRN, date of birth, ID band Patient awake    Reviewed: Allergy & Precautions, NPO status , Patient's Chart, lab work & pertinent test results  History of Anesthesia Complications Negative for: history of anesthetic complications  Airway Mallampati: II       Dental   Pulmonary           Cardiovascular negative cardio ROS       Neuro/Psych Anxiety Depression Bipolar Disorder    GI/Hepatic Neg liver ROS, GERD  Medicated and Controlled,  Endo/Other  Hypothyroidism   Renal/GU Renal disease (recurrent UTIs)     Musculoskeletal  (+) Fibromyalgia -  Abdominal   Peds  Hematology  (+) anemia ,   Anesthesia Other Findings   Reproductive/Obstetrics                             Anesthesia Physical Anesthesia Plan  ASA: II  Anesthesia Plan: General   Post-op Pain Management:    Induction: Intravenous  PONV Risk Score and Plan: 3 and Ondansetron, Dexamethasone, Propofol and Midazolam  Airway Management Planned: Oral ETT  Additional Equipment:   Intra-op Plan:   Post-operative Plan:   Informed Consent: I have reviewed the patients History and Physical, chart, labs and discussed the procedure including the risks, benefits and alternatives for the proposed anesthesia with the patient or authorized representative who has indicated his/her understanding and acceptance.     Plan Discussed with:   Anesthesia Plan Comments:         Anesthesia Quick Evaluation

## 2016-11-16 NOTE — Anesthesia Procedure Notes (Signed)
Procedure Name: Intubation Date/Time: 11/16/2016 10:28 AM Performed by: [facility] Pre-anesthesia Checklist: Patient identified, Emergency Drugs available, Suction available and Patient being monitored Patient Re-evaluated:Patient Re-evaluated prior to inductionOxygen Delivery Method: Circle system utilized Preoxygenation: Pre-oxygenation with 100% oxygen Intubation Type: IV induction Ventilation: Mask ventilation without difficulty Laryngoscope Size: Mac and 3 Grade View: Grade I Tube type: Oral Tube size: 7.0 mm Number of attempts: 1 Airway Equipment and Method: Patient positioned with wedge pillow and Stylet Placement Confirmation: ETT inserted through vocal cords under direct vision,  positive ETCO2,  CO2 detector and breath sounds checked- equal and bilateral Secured at: 21 cm Tube secured with: Tape Dental Injury: Teeth and Oropharynx as per pre-operative assessment

## 2016-11-17 ENCOUNTER — Encounter: Payer: Self-pay | Admitting: Obstetrics and Gynecology

## 2016-11-22 ENCOUNTER — Ambulatory Visit: Payer: Commercial Managed Care - HMO | Admitting: Obstetrics and Gynecology

## 2016-11-29 ENCOUNTER — Ambulatory Visit: Payer: Commercial Managed Care - HMO | Admitting: Obstetrics and Gynecology

## 2016-12-06 ENCOUNTER — Encounter: Payer: Self-pay | Admitting: Obstetrics and Gynecology

## 2016-12-06 ENCOUNTER — Ambulatory Visit (INDEPENDENT_AMBULATORY_CARE_PROVIDER_SITE_OTHER): Payer: 59 | Admitting: Obstetrics and Gynecology

## 2016-12-06 VITALS — BP 102/60 | HR 95 | Wt 183.0 lb

## 2016-12-06 DIAGNOSIS — Z4889 Encounter for other specified surgical aftercare: Secondary | ICD-10-CM

## 2016-12-06 NOTE — Progress Notes (Signed)
      Postoperative Follow-up Patient presents post op from diagnostic laparoscopy on 11/16/2016 for chronic pelvic pain, past history of endometriosis, right ovarian cyst.  Intraoperative findings showed no active endometriosis lesions, no scarring from prior endometriosis, and a simple apearing functional right ovarian cyst..  Subjective: Patient reports marked improvement in her preop symptoms. Eating a regular diet without difficulty. Not requiring any further postoperative pain meds although she is on Nucynta at baseline.  Activity: normal activities of daily living.  Objective: Vitals:   12/06/16 0858  BP: 102/60  Pulse: 95   Gen: NAD Abdomen: soft, non-tender, non-distended, trocar sites well healed Ext: no edema  Assessment: 31 y.o. s/p diagnostic laparoscopy stable  Plan: Patient has done well after surgery with no apparent complications.  I have discussed the post-operative course to date, and the expected progress moving forward.  The patient understands what complications to be concerned about.  I will see the patient in routine follow up, or sooner if needed.    Activity plan: No restriction.  As we previously discussed endometriosis left to its natural course may remain stable, progress, or in some cases resolve.  With no active lesions it appears her endometriosis has resolved.  Would continue Mirena IUD as it is effective in preventing progression or new endometriotic implants from forming.  Currently undergoing work up for possible connective tissue disease.   April [name] 12/06/2016, 9:42 PM

## 2017-09-02 ENCOUNTER — Encounter: Payer: Self-pay | Admitting: Obstetrics and Gynecology

## 2017-09-02 ENCOUNTER — Ambulatory Visit: Payer: 59 | Admitting: Obstetrics and Gynecology

## 2017-09-02 VITALS — BP 104/56 | HR 97 | Wt 188.0 lb

## 2017-09-02 DIAGNOSIS — Z30431 Encounter for routine checking of intrauterine contraceptive device: Secondary | ICD-10-CM | POA: Diagnosis not present

## 2017-09-02 NOTE — Progress Notes (Signed)
Obstetrics & Gynecology Office Visit   Chief Complaint:  Chief Complaint  Patient presents with  . IUD check    light bleeding/pain with IUD    History of Present Illness: 32 y.o. patient presenting for follow up of Mirena IUD placement 1 year ago.  The indication for her IUD was endometriosis.  She reports any complications since her IUD placement.  She has achieved amenorrhea but recently experienced some light spotting which she had not previously noted, no significant cramping or abdominal pain associated with this.  is able to feel one string.    Review of Systems: Review of Systems  Constitutional: Negative for chills and fever.  Gastrointestinal: Negative for abdominal pain.  Endo/Heme/Allergies: Does not bruise/bleed easily.    Past Medical History:  Past Medical History:  Diagnosis Date  . Acid reflux   . Anemia   . Anxiety   . ASTHMA UNSPECIFIED WITH EXACERBATION 01/29/2008  . Chronic fatigue syndrome   . Depression   . Dysautonomia (HCC)   . Endometriosis   . Fibromyalgia   . Fibromyalgia syndrome   . Heartburn   . Kidney infection   . Ovarian cyst   . Subclinical hypothyroidism   . Vasovagal response     Past Surgical History:  Past Surgical History:  Procedure Laterality Date  . CYST EXCISION     wrist  . DIAGNOSTIC LAPAROSCOPY    . Laparascopic     Endometrial scar tissue removed  . LAPAROSCOPY N/A 11/16/2016   Procedure: LAPAROSCOPY DIAGNOSTIC;  Surgeon: Vena Austria, MD;  Location: [facility] ORS;  Service: Gynecology;  Laterality: N/A;  . WISDOM TOOTH EXTRACTION      Gynecologic History: No LMP recorded. (Menstrual status: IUD).  Obstetric History: No obstetric history on file.  Family History:  Family History  Problem Relation Age of Onset  . Depression Mother   . Prostate cancer Neg Hx   . Kidney cancer Neg Hx   . Bladder Cancer Neg Hx     Social History:  Social History   Socioeconomic History  . Marital status: Single   Spouse name: Not on file  . Number of children: Not on file  . Years of education: Not on file  . Highest education level: Not on file  Occupational History  . Not on file  Social Needs  . Financial resource strain: Not on file  . Food insecurity:    Worry: Not on file    Inability: Not on file  . Transportation needs:    Medical: Not on file    Non-medical: Not on file  Tobacco Use  . Smoking status: Never Smoker  . Smokeless tobacco: Never Used  Substance and Sexual Activity  . Alcohol use: No  . Drug use: No  . Sexual activity: Yes    Birth control/protection: IUD  Lifestyle  . Physical activity:    Days per week: Not on file    Minutes per session: Not on file  . Stress: Not on file  Relationships  . Social connections:    Talks on phone: Not on file    Gets together: Not on file    Attends religious service: Not on file    Active member of club or organization: Not on file    Attends meetings of clubs or organizations: Not on file    Relationship status: Not on file  . Intimate partner violence:    Fear of current or ex partner: Not on file  Emotionally abused: Not on file    Physically abused: Not on file    Forced sexual activity: Not on file  Other Topics Concern  . Not on file  Social History Narrative   ** Merged History Encounter **        Allergies:  Allergies  Allergen Reactions  . Amoxicillin Other (See Comments)    Causes other meds to stop working  . Aripiprazole     Tremors  . Ciprofloxacin     Cant take in combination of fluoxetine and bupropion, causes those medicines to lose their efficacy   . Codeine     loopy  . Other     Bleach - eczema rashes   . Zolpidem Tartrate     Hallucinations     Medications: Prior to Admission medications   Medication Sig Start Date End Date Taking? Authorizing Provider  buPROPion (WELLBUTRIN XL) 300 MG 24 hr tablet TAKE 1 TABLET BY MOUTH EVERY DAY Patient taking differently: TAKE 1 TABLET BY MOUTH  EVERY DAY IN THE EVENING 06/04/15  Yes Duanne Limerick, MD  Carboxymethylcellulose Sodium (REFRESH TEARS OP) Apply 1 drop to eye daily as needed (allergies).   Yes [provider]  Cholecalciferol (VITAMIN D3) 2000 units CHEW Chew 4,000 Units by mouth every evening.   Yes [provider]  FLUoxetine (PROZAC) 40 MG capsule Take 1 capsule (40 mg total) by mouth daily. Patient taking differently: Take 40 mg by mouth every evening.  01/30/14  Yes Donita Brooks, MD  levonorgestrel (MIRENA) 20 MCG/24HR IUD 1 each by Intrauterine route once.   Yes [provider]  omeprazole (PRILOSEC) 40 MG capsule TAKE ONE CAPSULE BY MOUTH EVERY DAY Patient taking differently: TAKE ONE CAPSULE BY MOUTH EVERY DAY IN THE EVENING 06/04/15  Yes Duanne Limerick, MD  SUMAtriptan (IMITREX) 50 MG tablet Take 50 mg by mouth daily as needed for migraine.  05/24/16  Yes [provider]  tapentadol (NUCYNTA) 50 MG tablet Take 50-100 mg by mouth daily as needed for severe pain.  08/23/16  Yes [provider]  traZODone (DESYREL) 100 MG tablet Take 100 mg by mouth at bedtime.  05/24/16  Yes [provider]  loperamide (IMODIUM) 2 MG capsule Take 2 mg by mouth 2 (two) times daily as needed for diarrhea or loose stools.     [provider]    Physical Exam Blood pressure (!) 104/56, pulse 97, weight 188 lb (85.3 kg). No LMP recorded. (Menstrual status: IUD).  General: NAD HEENT: normocephalic, anicteric Pulmonary: No increased work of breathing Genitourinary:  External: Normal external female genitalia.  Normal urethral meatus, normal  Bartholin's and Skene's glands.    Vagina: Normal vaginal mucosa, no evidence of prolapse.    Cervix: Grossly normal in appearance, no bleeding, IUD strings visualized 2cm  Rectal: deferred  Lymphatic: no evidence of inguinal lymphadenopathy Extremities: no edema, erythema, or tenderness Neurologic: Grossly intact Psychiatric:  mood appropriate, affect full  Female chaperone present for pelvic and breast  portions of the physical exam  Assessment: 32 y.o. IUD check up Plan: Problem List Items Addressed This Visit    None    Visit Diagnoses    IUD check up    -  Primary       1.  The patient was given instructions to check her IUD strings monthly and call with any problems or concerns.  She should call for fevers, chills, abnormal vaginal discharge, pelvic pain, or other complaints.  Bleeding pattern to be expected with levonorgestrel containing IUD's discussed.  Intermittent spotting at irregular intervals is not unusual and general occurs at irregular intervals and should be relatively light.    2.   IUDs while effective at preventing pregnancy do not prevent transmission of sexually transmitted diseases and use of barrier methods for this purpose was discussed.  Low overall incidence of failure with 99.7% efficacy rate in typical use.  The patient has not contraindication to IUD placement.  3) A total of 15 minutes were spent in face-to-face contact with the patient during this encounter with over half of that time devoted to counseling and coordination of care.  4) Return in about 1 year (around 09/03/2018) for annual.   Vena AustriaAndreas Ketara Cavness, MD, Merlinda FrederickFACOG [facility], [facility] 09/02/2017, 12:14 PM

## 2017-10-19 IMAGING — CR DG SACRUM/COCCYX 2+V
3 series · 3 of 3 positions shown · non-contrast
Comparison: CT 09/14/2010

CLINICAL DATA: Fell down stairs with injury to coccyx.

EXAM:
SACRUM AND COCCYX - 2+ VIEW

[coccyx ap]
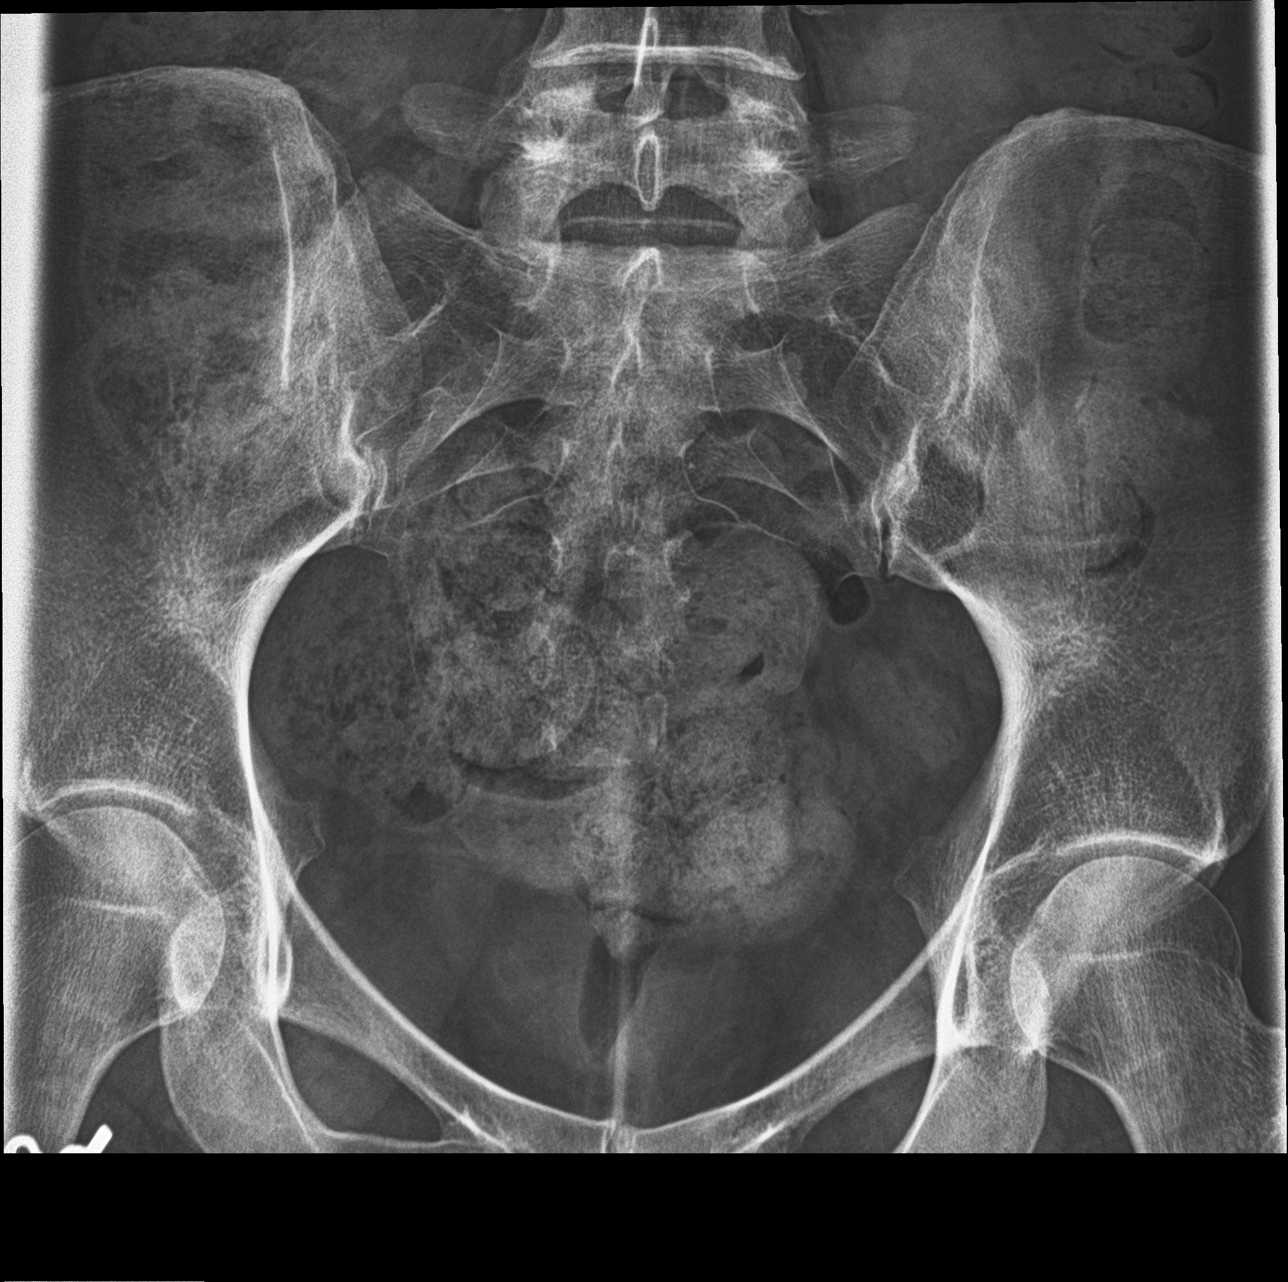

[sacrum ap]
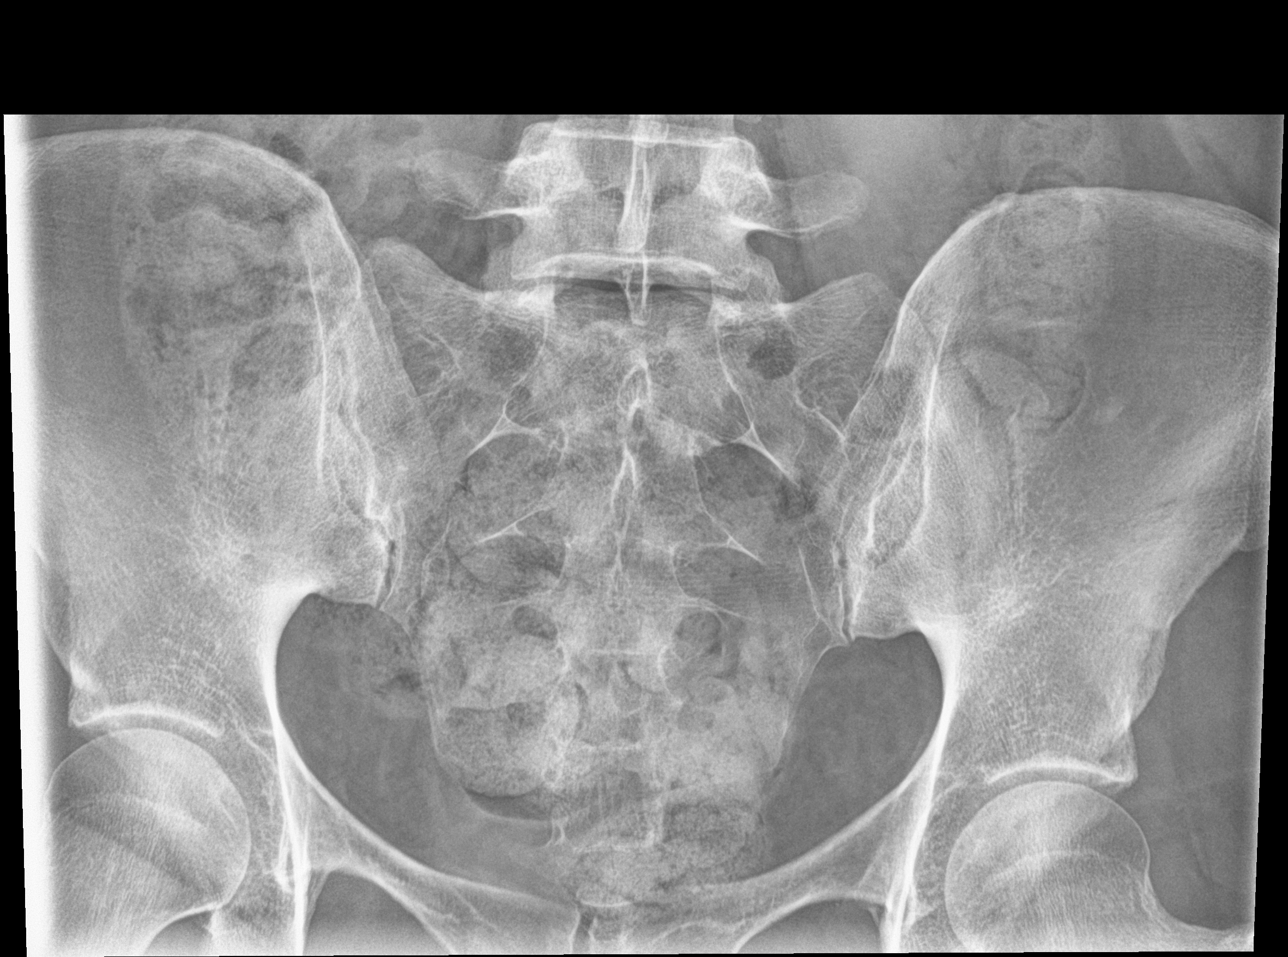

[sacrum lat]
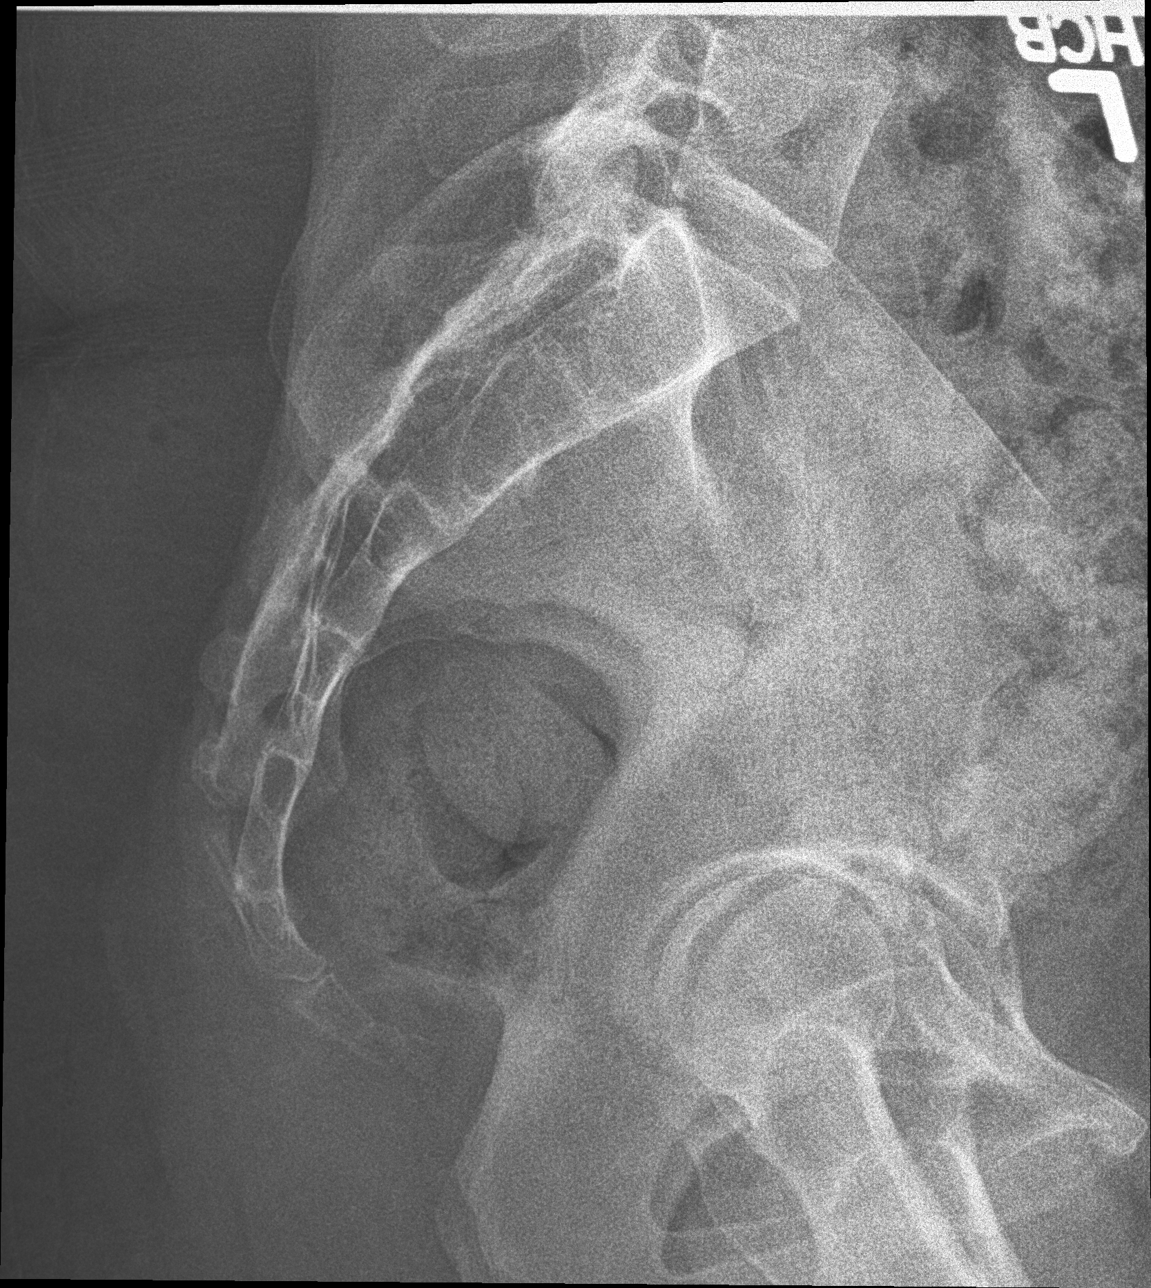

[3 of 3 positions shown; findings below may reference images not displayed]

FINDINGS: There is no evidence of fracture or other focal bone lesions.
IMPRESSION: Negative.
